# Patient Record
Sex: Male | Born: 1985 | Race: White | Hispanic: No | Marital: Single | Smoking: Current every day smoker
Health system: Southern US, Community
[De-identification: ages and names within clinical notes are randomized; demographics above are authoritative.]

## PROBLEM LIST (undated history)

## (undated) HISTORY — PX: CHOLECYSTECTOMY: SHX55

---
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
## 2008-02-21 ENCOUNTER — Emergency Department: Payer: Self-pay | Admitting: Emergency Medicine

## 2009-07-10 ENCOUNTER — Emergency Department: Payer: Self-pay | Admitting: Emergency Medicine

## 2009-09-19 ENCOUNTER — Emergency Department: Payer: Self-pay | Admitting: Emergency Medicine

## 2009-09-22 ENCOUNTER — Ambulatory Visit: Payer: Self-pay | Admitting: Surgery

## 2010-08-22 IMAGING — CR DG CHEST 2V
1 series · 2 of 2 positions shown · non-contrast
Comparison: none

REASON FOR EXAM: COUGH
COMMENTS:

PROCEDURE:     DXR - DXR CHEST PA (OR AP) AND LATERAL  - September 19, 2009  [DATE]
RESULT:     The lungs are adequately inflated. There is no focal infiltrate.
The heart is normal in size. The pulmonary vascularity is not engorged.
There is no pleural effusion.

[Series 1: view not recorded · 0.17mm/px · 2 of 2 slices shown]
[im 1/2]
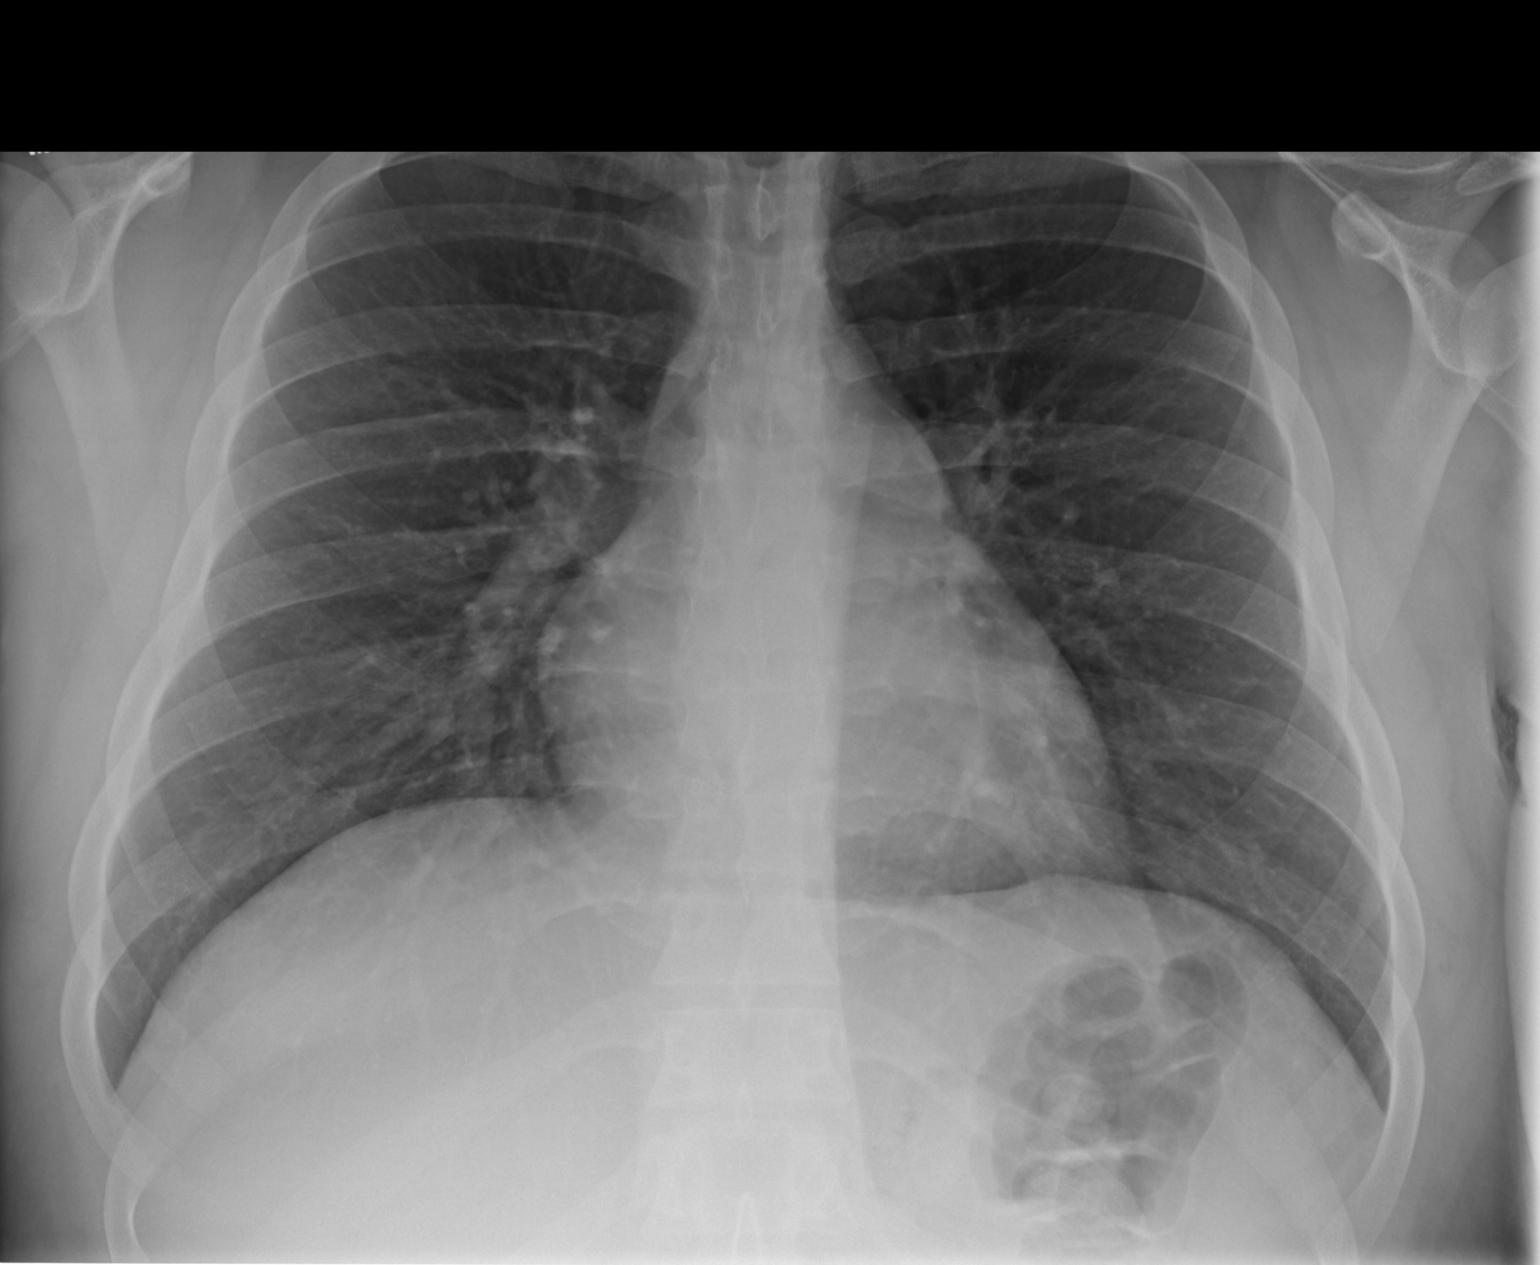
[im 2/2]
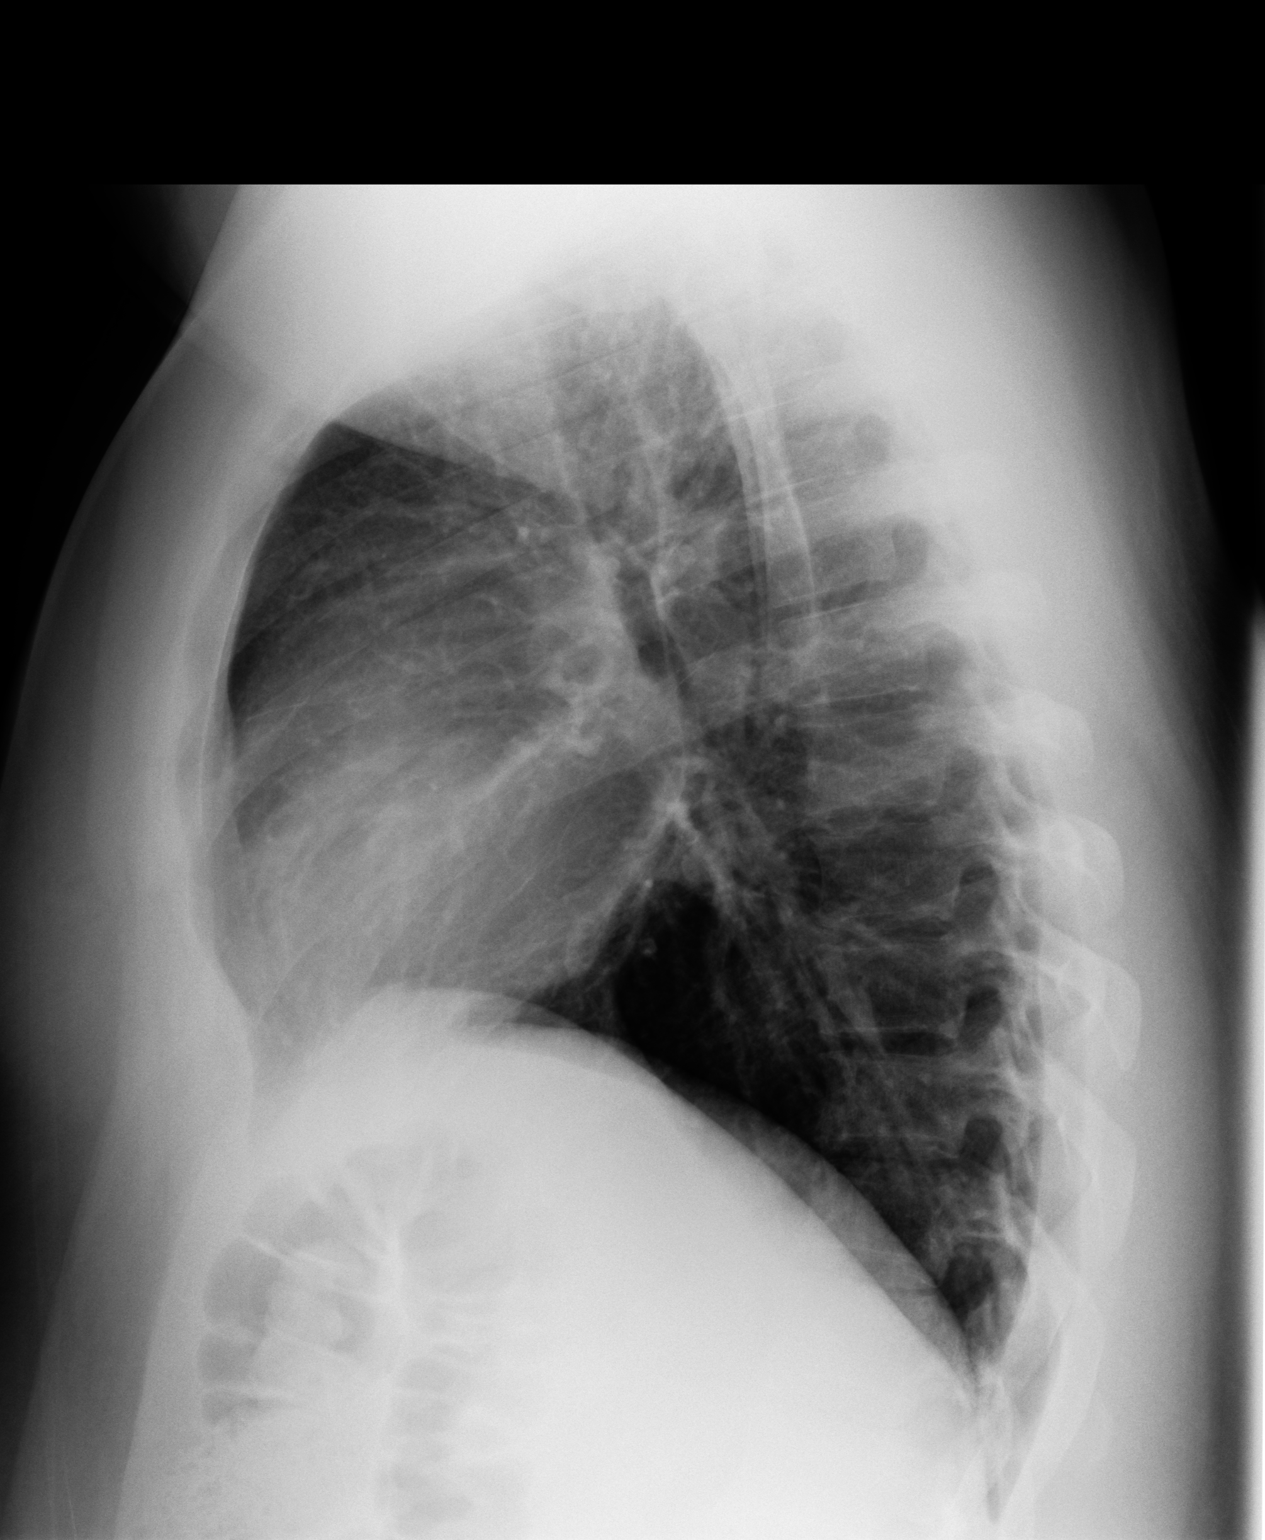

[2 of 2 positions shown; findings below may reference images not displayed]

IMPRESSION: I do not see evidence of pneumonia nor CHF nor other acute
cardiopulmonary abnormality.

## 2010-08-22 IMAGING — US ABDOMEN ULTRASOUND
1 series · 17 of 25 positions shown · non-contrast
Comparison: none

REASON FOR EXAM: RUQ PAIN
COMMENTS:

[Series 1: abdomen ultrasound · 17 of 68 slices shown]
[im 1/68]
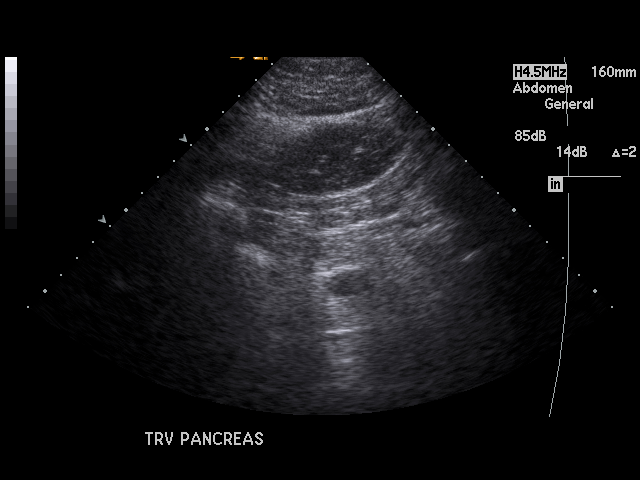
[im 6/68]
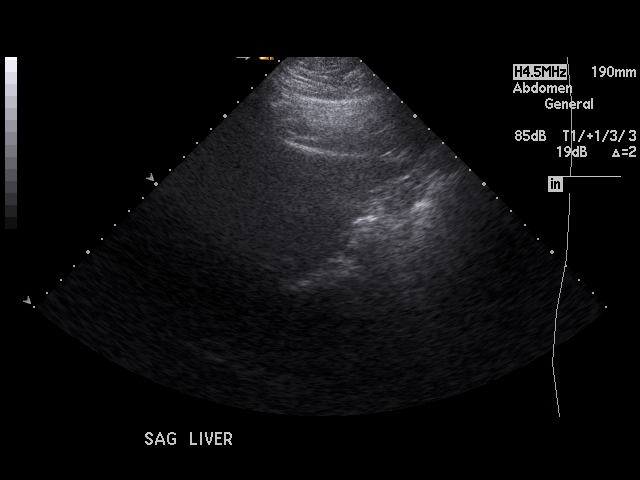
[im 9/68]
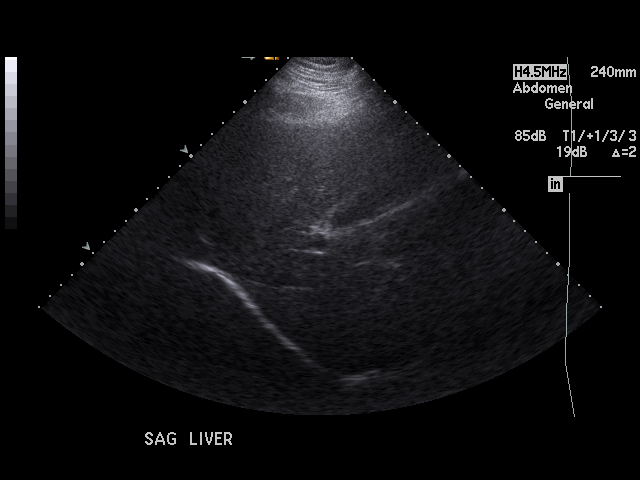
[im 14/68]
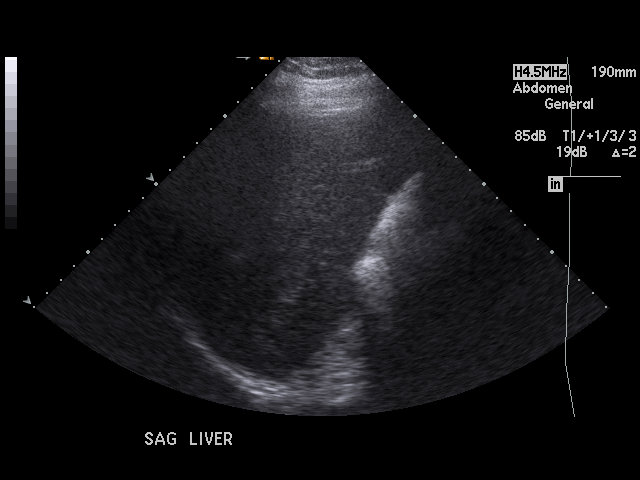
[im 17/68]
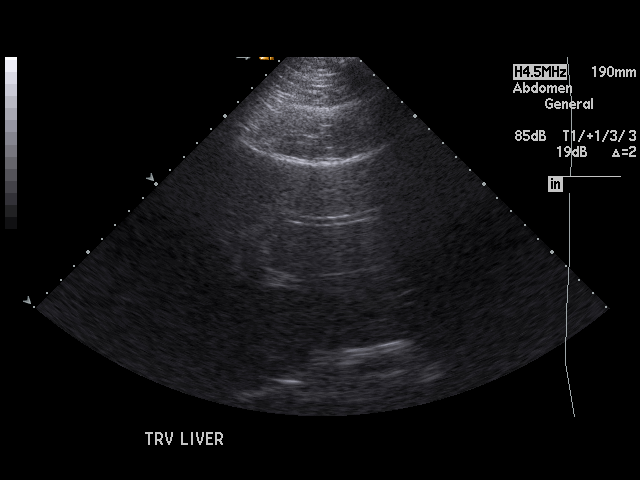
[im 23/68]
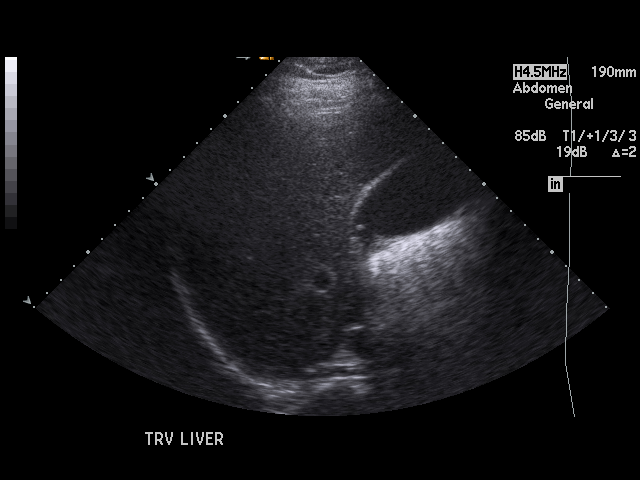
[im 26/68]
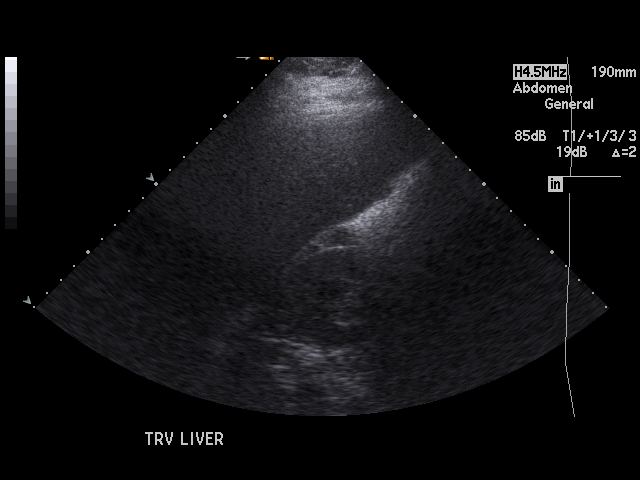
[im 31/68]
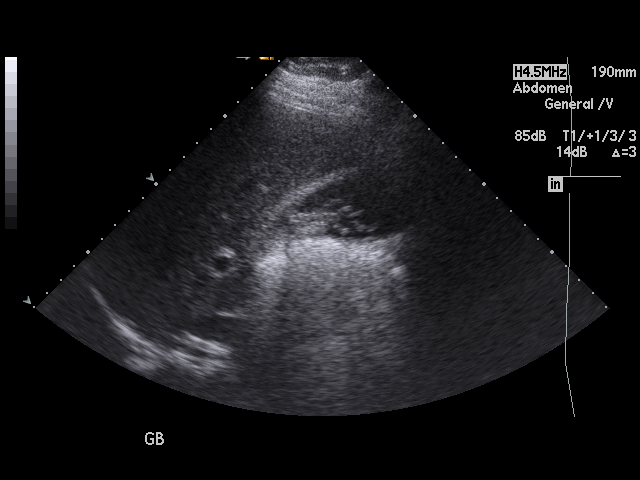
[im 34/68]
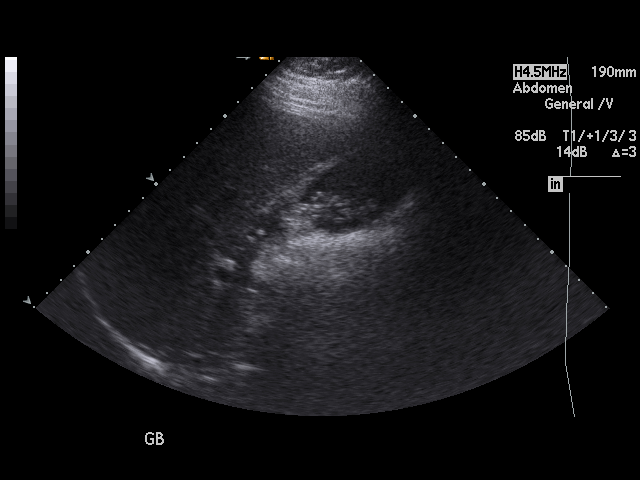
[im 37/68]
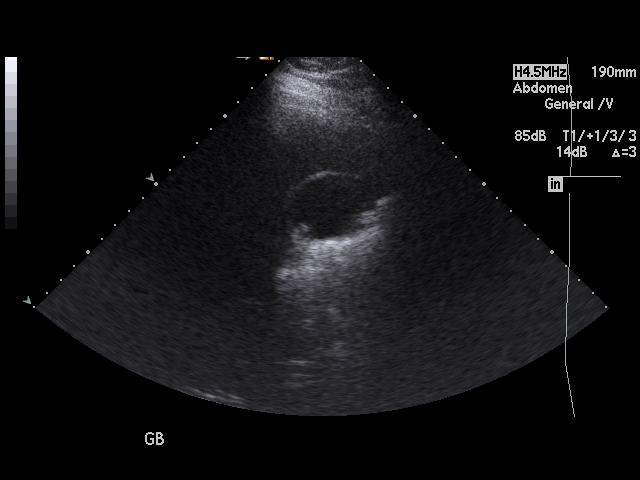
[im 42/68]
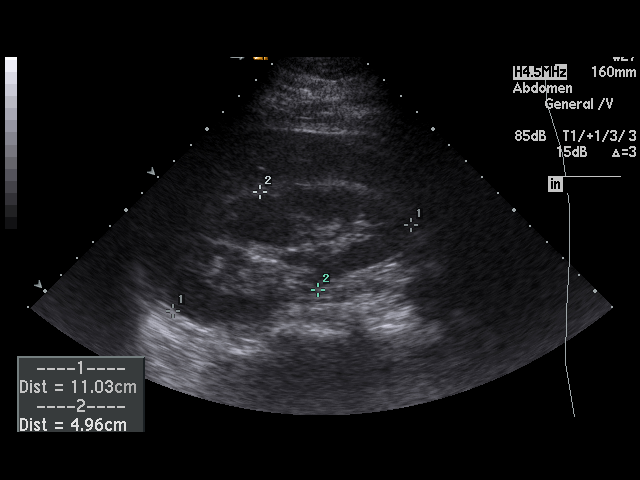
[im 45/68]
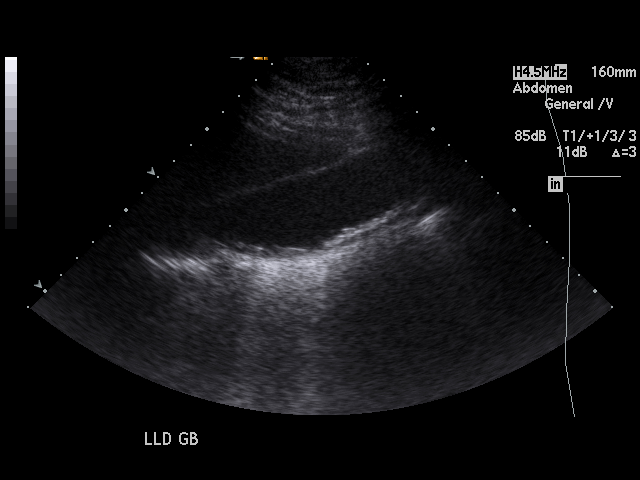
[im 51/68]
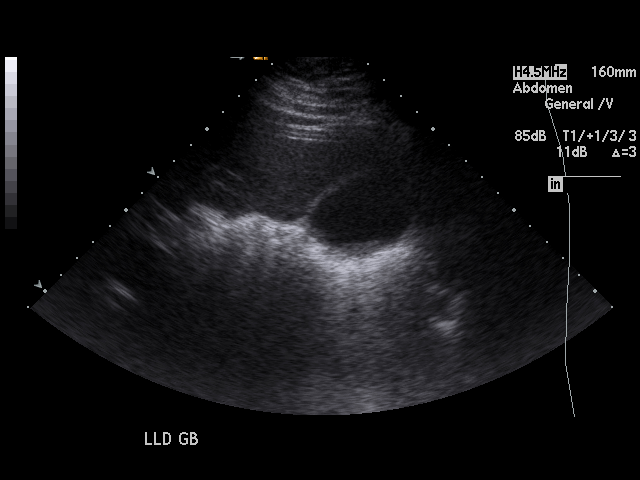
[im 54/68]
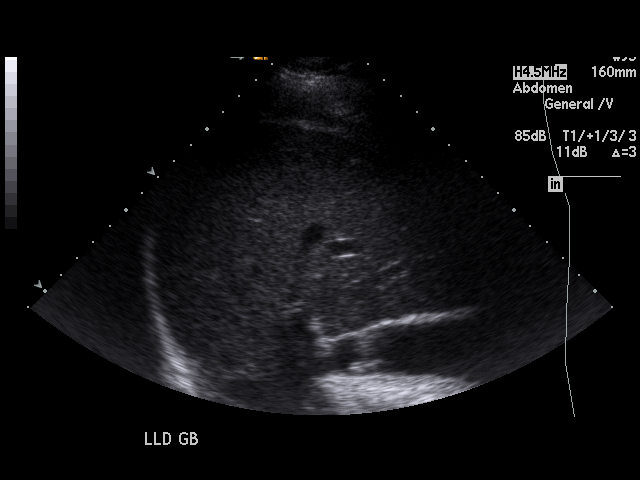
[im 59/68]
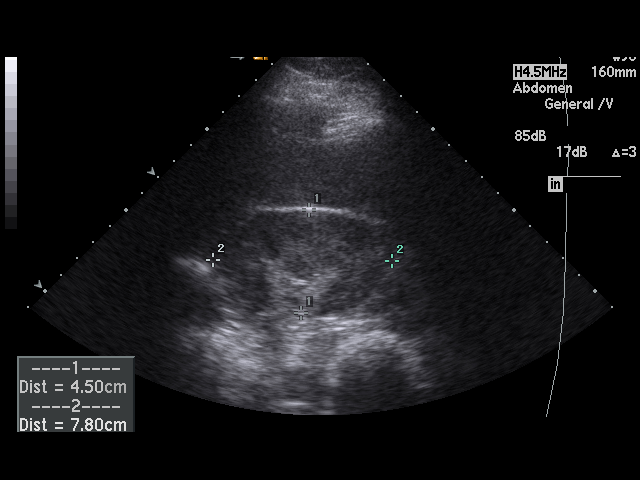
[im 62/68]
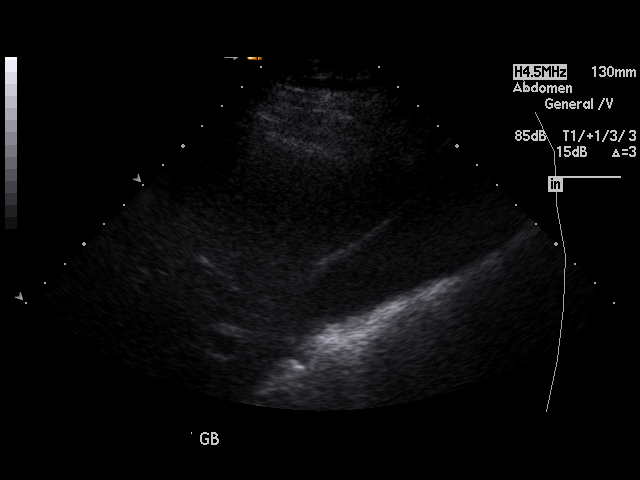
[im 68/68]
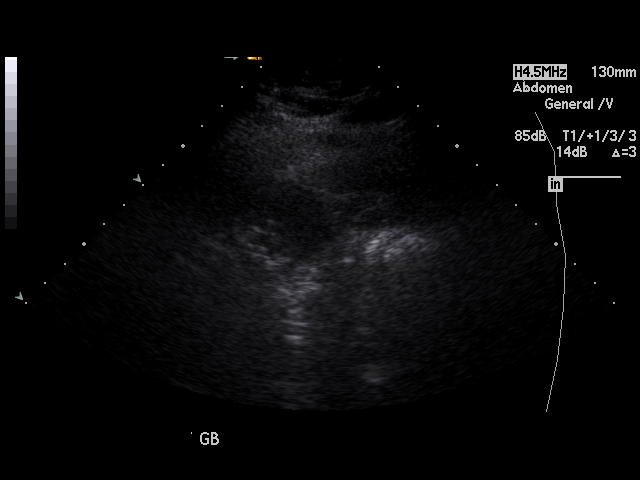

[17 of 25 positions shown; findings below may reference images not displayed]

PROCEDURE:     US  - US ABDOMEN GENERAL SURVEY  - September 19, 2009  [DATE]

RESULT:     The gallbladder is adequately distended and contains multiple
echogenic mobile shadowing stones. There is no sonographic Murphy's sign.
The gallbladder wall remains normal at 2.9 mm. There is no pericholecystic
fluid. The common bile duct is normal at 4.3 mm in diameter. The liver
exhibits no focal mass nor ductal dilation. Portal venous flow is normal in
direction toward the liver. The spleen and kidneys are normal in appearance.
The abdominal aorta and pancreas could not be adequately demonstrated due to
the presence of bowel gas. There is no evidence of ascites.
IMPRESSION: 1. There are multiple mobile gallstones present without evidence of a
sonographic Murphy's sign.
2. Evaluation of the pancreas and abdominal aorta is quite limited due to
bowel gas.

## 2012-06-14 ENCOUNTER — Emergency Department: Payer: Self-pay | Admitting: Emergency Medicine

## 2013-08-22 ENCOUNTER — Emergency Department: Payer: Self-pay | Admitting: Emergency Medicine

## 2013-08-30 ENCOUNTER — Emergency Department: Payer: Self-pay | Admitting: Emergency Medicine

## 2014-06-18 ENCOUNTER — Emergency Department: Payer: Self-pay | Admitting: Emergency Medicine

## 2014-06-18 LAB — CBC WITH DIFFERENTIAL/PLATELET
BASOS ABS: 0 10*3/uL (ref 0.0–0.1)
BASOS PCT: 0.6 %
EOS PCT: 3.2 %
Eosinophil #: 0.2 10*3/uL (ref 0.0–0.7)
HCT: 44.8 % (ref 40.0–52.0)
HGB: 14.9 g/dL (ref 13.0–18.0)
LYMPHS PCT: 33.4 %
Lymphocyte #: 2.4 10*3/uL (ref 1.0–3.6)
MCH: 28.8 pg (ref 26.0–34.0)
MCHC: 33.3 g/dL (ref 32.0–36.0)
MCV: 87 fL (ref 80–100)
MONOS PCT: 5.9 %
Monocyte #: 0.4 x10 3/mm (ref 0.2–1.0)
NEUTROS ABS: 4.2 10*3/uL (ref 1.4–6.5)
Neutrophil %: 56.9 %
Platelet: 220 10*3/uL (ref 150–440)
RBC: 5.17 10*6/uL (ref 4.40–5.90)
RDW: 14 % (ref 11.5–14.5)
WBC: 7.3 10*3/uL (ref 3.8–10.6)

## 2014-06-18 LAB — URINALYSIS, COMPLETE
BLOOD: NEGATIVE
Bacteria: NEGATIVE
Bilirubin,UR: NEGATIVE
GLUCOSE, UR: NEGATIVE mg/dL (ref 0–75)
Ketone: NEGATIVE
LEUKOCYTE ESTERASE: NEGATIVE
Nitrite: NEGATIVE
PROTEIN: NEGATIVE
Ph: 7 (ref 4.5–8.0)
RBC,UR: NONE SEEN /HPF (ref 0–5)
SPECIFIC GRAVITY: 1.023 (ref 1.003–1.030)
WBC UR: NONE SEEN /HPF (ref 0–5)

## 2014-06-18 LAB — COMPREHENSIVE METABOLIC PANEL
ALK PHOS: 94 U/L
Albumin: 3 g/dL — ABNORMAL LOW (ref 3.4–5.0)
Anion Gap: 11 (ref 7–16)
BUN: 15 mg/dL (ref 7–18)
Bilirubin,Total: 0.4 mg/dL (ref 0.2–1.0)
CO2: 17 mmol/L — AB (ref 21–32)
CREATININE: 0.86 mg/dL (ref 0.60–1.30)
Calcium, Total: 8.7 mg/dL (ref 8.5–10.1)
Chloride: 109 mmol/L — ABNORMAL HIGH (ref 98–107)
EGFR (African American): >60
Glucose: 88 mg/dL (ref 65–99)
Osmolality: 274 (ref 275–301)
Potassium: 4 mmol/L (ref 3.5–5.1)
SGOT(AST): 70 U/L — ABNORMAL HIGH (ref 15–37)
SGPT (ALT): 39 U/L (ref 12–78)
Sodium: 137 mmol/L (ref 136–145)
TOTAL PROTEIN: 7.7 g/dL (ref 6.4–8.2)

## 2014-06-18 LAB — TROPONIN I

## 2014-08-03 ENCOUNTER — Emergency Department: Payer: Self-pay | Admitting: Emergency Medicine

## 2014-08-25 ENCOUNTER — Emergency Department: Payer: Self-pay | Admitting: Emergency Medicine

## 2014-12-02 ENCOUNTER — Emergency Department: Payer: Self-pay | Admitting: Student

## 2015-03-24 ENCOUNTER — Emergency Department
Admit: 2015-03-24 | Disposition: A | Discharge status: Home / Self Care | Payer: Self-pay | Admitting: Emergency Medicine

## 2015-06-02 ENCOUNTER — Emergency Department
Admission: EM | Admit: 2015-06-02 | Discharge: 2015-06-02 | Disposition: A | Discharge status: Home / Self Care | Payer: Self-pay | Attending: Emergency Medicine | Admitting: Emergency Medicine

## 2015-06-02 ENCOUNTER — Emergency Department: Payer: Self-pay

## 2015-06-02 ENCOUNTER — Other Ambulatory Visit: Payer: Self-pay

## 2015-06-02 ENCOUNTER — Encounter: Payer: Self-pay | Admitting: Urgent Care

## 2015-06-02 DIAGNOSIS — R079 Chest pain, unspecified: Secondary | ICD-10-CM | POA: Insufficient documentation

## 2015-06-02 DIAGNOSIS — R42 Dizziness and giddiness: Secondary | ICD-10-CM | POA: Insufficient documentation

## 2015-06-02 DIAGNOSIS — Z720 Tobacco use: Secondary | ICD-10-CM | POA: Insufficient documentation

## 2015-06-02 DIAGNOSIS — J209 Acute bronchitis, unspecified: Secondary | ICD-10-CM | POA: Insufficient documentation

## 2015-06-02 MED ORDER — AZITHROMYCIN 250 MG PO TABS
ORAL_TABLET | ORAL | Status: AC
  Administered 2015-06-02: 500 mg via ORAL
  Filled 2015-06-02: qty 2

## 2015-06-02 MED ORDER — AZITHROMYCIN 250 MG PO TABS
ORAL_TABLET | ORAL | Status: AC
  Filled 2015-06-02: qty 2

## 2015-06-02 MED ORDER — AZITHROMYCIN 250 MG PO TABS: 500.0000 mg | ORAL_TABLET | Freq: Every day | ORAL | Status: DC

## 2015-06-02 MED ORDER — BENZONATATE 100 MG PO CAPS
200.0000 mg | ORAL_CAPSULE | Freq: Once | ORAL | Status: AC
  Administered 2015-06-02: 200 mg via ORAL

## 2015-06-02 MED ORDER — BENZONATATE 100 MG PO CAPS
ORAL_CAPSULE | ORAL | Status: AC
  Filled 2015-06-02: qty 1

## 2015-06-02 MED ORDER — BENZONATATE 100 MG PO CAPS
ORAL_CAPSULE | ORAL | Status: AC
  Administered 2015-06-02: 200 mg via ORAL
  Filled 2015-06-02: qty 2

## 2015-06-02 MED ORDER — AZITHROMYCIN 250 MG PO TABS
500.0000 mg | ORAL_TABLET | Freq: Once | ORAL | Status: AC
  Administered 2015-06-02: 500 mg via ORAL

## 2015-06-02 MED ORDER — BENZONATATE 200 MG PO CAPS: 200.0000 mg | ORAL_CAPSULE | Freq: Three times a day (TID) | ORAL | Status: DC | PRN

## 2015-06-02 NOTE — ED Provider Notes (Signed)
[facility] Emergency Department Provider Note  ____________________________________________  Time seen: 2:20 AM  I have reviewed the triage vital signs and the nursing notes.   HISTORY  Chief Complaint Chest Pain; Cough; and Dizziness      HPI Derek Ellis is a 29 y.o. male presents with history of cough this fever 3 days. Patient smokes a pack cigarettes a day.      History reviewed. No pertinent past medical history.  There are no active problems to display for this patient.   Past Surgical History  Procedure Laterality Date  . Cholecystectomy      No current outpatient prescriptions on file.  Allergies Review of patient's allergies indicates no known allergies.  No family history on file.  Social History History  Substance Use Topics  . Smoking status: Current Every Day Smoker  . Smokeless tobacco: Not on file  . Alcohol Use: Yes    Review of Systems  Constitutional: Negative for fever. Eyes: Negative for visual changes. ENT: Negative for sore throat. Cardiovascular: Negative for chest pain. Respiratory: Negative for shortness of breath. Gastrointestinal: Negative for abdominal pain, vomiting and diarrhea. Genitourinary: Negative for dysuria. Musculoskeletal: Negative for back pain. Skin: Negative for rash. Neurological: Negative for headaches, focal weakness or numbness.   10-point ROS otherwise negative.  ____________________________________________   PHYSICAL EXAM:  VITAL SIGNS: ED Triage Vitals  Enc Vitals Group     BP 06/02/15 0101 150/87 mmHg     Pulse Rate 06/02/15 0101 101     Resp 06/02/15 0101 18     Temp 06/02/15 0101 99.1 F (37.3 C)     Temp Source 06/02/15 0101 Oral     SpO2 06/02/15 0101 93 %     Weight 06/02/15 0101 350 lb (158.759 kg)     Height 06/02/15 0101 6' (1.829 m)     Head Cir --      Peak Flow --      Pain Score 06/02/15 0104 7     Pain Loc --      Pain Edu? --      Excl. in GC?  --      Constitutional: Alert and oriented. Well appearing and in no distress. Eyes: Conjunctivae are normal. PERRL. Normal extraocular movements. ENT   Head: Normocephalic and atraumatic.   Nose: No congestion/rhinnorhea.   Mouth/Throat: Mucous membranes are moist.   Neck: No stridor. Hematological/Lymphatic/Immunilogical: No cervical lymphadenopathy. Cardiovascular: Normal rate, regular rhythm. Normal and symmetric distal pulses are present in all extremities. No murmurs, rubs, or gallops. Respiratory: Normal respiratory effort without tachypnea nor retractions. Breath sounds are clear and equal bilaterally. No wheezes/rales/rhonchi. Gastrointestinal: Soft and nontender. No distention. There is no CVA tenderness. Genitourinary: deferred Musculoskeletal: Nontender with normal range of motion in all extremities. No joint effusions.  No lower extremity tenderness nor edema. Neurologic:  Normal speech and language. No gross focal neurologic deficits are appreciated. Speech is normal.  Skin:  Skin is warm, dry and intact. No rash noted. Psychiatric: Mood and affect are normal. Speech and behavior are normal. Patient exhibits appropriate insight and judgment.  ____________________________________________   Radiology: Chest x-ray revealed no acute cardiopulmonary process    INITIAL IMPRESSION / ASSESSMENT AND PLAN / ED COURSE  Pertinent labs & imaging results that were available during my care of the patient were reviewed by me and considered in my medical decision making (see chart for details).  History physical exam, chest x-ray consistent with acute bronchitis as such patient  received azithromycin 500 mg tablet 1 and Tessalon Perles for cough. We prescribed the same at home  ____________________________________________   FINAL CLINICAL IMPRESSION(S) / ED DIAGNOSES  Final diagnoses:  Acute bronchitis, unspecified organism      Darci Current, MD 06/02/15  0300

## 2015-06-02 NOTE — ED Notes (Signed)
Spoke with Dr. Manson Passey regarding presenting c/o and triage assessment. MD only wanting the previously ordered EKG and 2 view CXR; no labs at this time. Orders to be entered by this RN.

## 2015-06-02 NOTE — Discharge Instructions (Signed)
Acute Bronchitis °Bronchitis is inflammation of the airways that extend from the windpipe into the lungs (bronchi). The inflammation often causes mucus to develop. This leads to a cough, which is the most common symptom of bronchitis.  °In acute bronchitis, the condition usually develops suddenly and goes away over time, usually in a couple weeks. Smoking, allergies, and asthma can make bronchitis worse. Repeated episodes of bronchitis Jacobowitz cause further lung problems.  °CAUSES °Acute bronchitis is most often caused by the same virus that causes a cold. The virus can spread from person to person (contagious) through coughing, sneezing, and touching contaminated objects. °SIGNS AND SYMPTOMS  °· Cough.   °· Fever.   °· Coughing up mucus.   °· Body aches.   °· Chest congestion.   °· Chills.   °· Shortness of breath.   °· Sore throat.   °DIAGNOSIS  °Acute bronchitis is usually diagnosed through a physical exam. Your health care provider will also ask you questions about your medical history. Tests, such as chest X-rays, are sometimes done to rule out other conditions.  °TREATMENT  °Acute bronchitis usually goes away in a couple weeks. Oftentimes, no medical treatment is necessary. Medicines are sometimes given for relief of fever or cough. Antibiotic medicines are usually not needed but Labreck be prescribed in certain situations. In some cases, an inhaler Hardiman be recommended to help reduce shortness of breath and control the cough. A cool mist vaporizer Schader also be used to help thin bronchial secretions and make it easier to clear the chest.  °HOME CARE INSTRUCTIONS °· Get plenty of rest.   °· Drink enough fluids to keep your urine clear or pale yellow (unless you have a medical condition that requires fluid restriction). Increasing fluids Doane help thin your respiratory secretions (sputum) and reduce chest congestion, and it will prevent dehydration.   °· Take medicines only as directed by your health care provider. °· If  you were prescribed an antibiotic medicine, finish it all even if you start to feel better. °· Avoid smoking and secondhand smoke. Exposure to cigarette smoke or irritating chemicals will make bronchitis worse. If you are a smoker, consider using nicotine gum or skin patches to help control withdrawal symptoms. Quitting smoking will help your lungs heal faster.   °· Reduce the chances of another bout of acute bronchitis by washing your hands frequently, avoiding people with cold symptoms, and trying not to touch your hands to your mouth, nose, or eyes.   °· Keep all follow-up visits as directed by your health care provider.   °SEEK MEDICAL CARE IF: °Your symptoms do not improve after 1 week of treatment.  °SEEK IMMEDIATE MEDICAL CARE IF: °· You develop an increased fever or chills.   °· You have chest pain.   °· You have severe shortness of breath. °· You have bloody sputum.   °· You develop dehydration. °· You faint or repeatedly feel like you are going to pass out. °· You develop repeated vomiting. °· You develop a severe headache. °MAKE SURE YOU:  °· Understand these instructions. °· Will watch your condition. °· Will get help right away if you are not doing well or get worse. °Document Released: 01/10/2005 Document Revised: 04/19/2014 Document Reviewed: 05/26/2013 °Exit[employer]® Patient Information ©2015 [employer]. This information is not intended to replace advice given to you by your health care provider. Make sure you discuss any questions you have with your health care provider. ° °

## 2015-06-02 NOTE — ED Notes (Signed)
Patient presents with c/o fever, cough, SOB and a non -specific CP x the last 2-3 days. Fevers have reached in excess of 100.5 per patient report.

## 2016-05-04 IMAGING — CR DG CHEST 2V
1 series · 2 of 2 positions shown · non-contrast
Comparison: 09/19/2009

CLINICAL DATA: Fever, cough, shortness of breath, and nonspecific
chest pain over the last 3 days. History of asthma and smoking.

EXAM:
CHEST  2 VIEW

[Series 1: dg chest 2 view · 0.14mm/px · 2 of 2 slices shown]
[im 1/2]
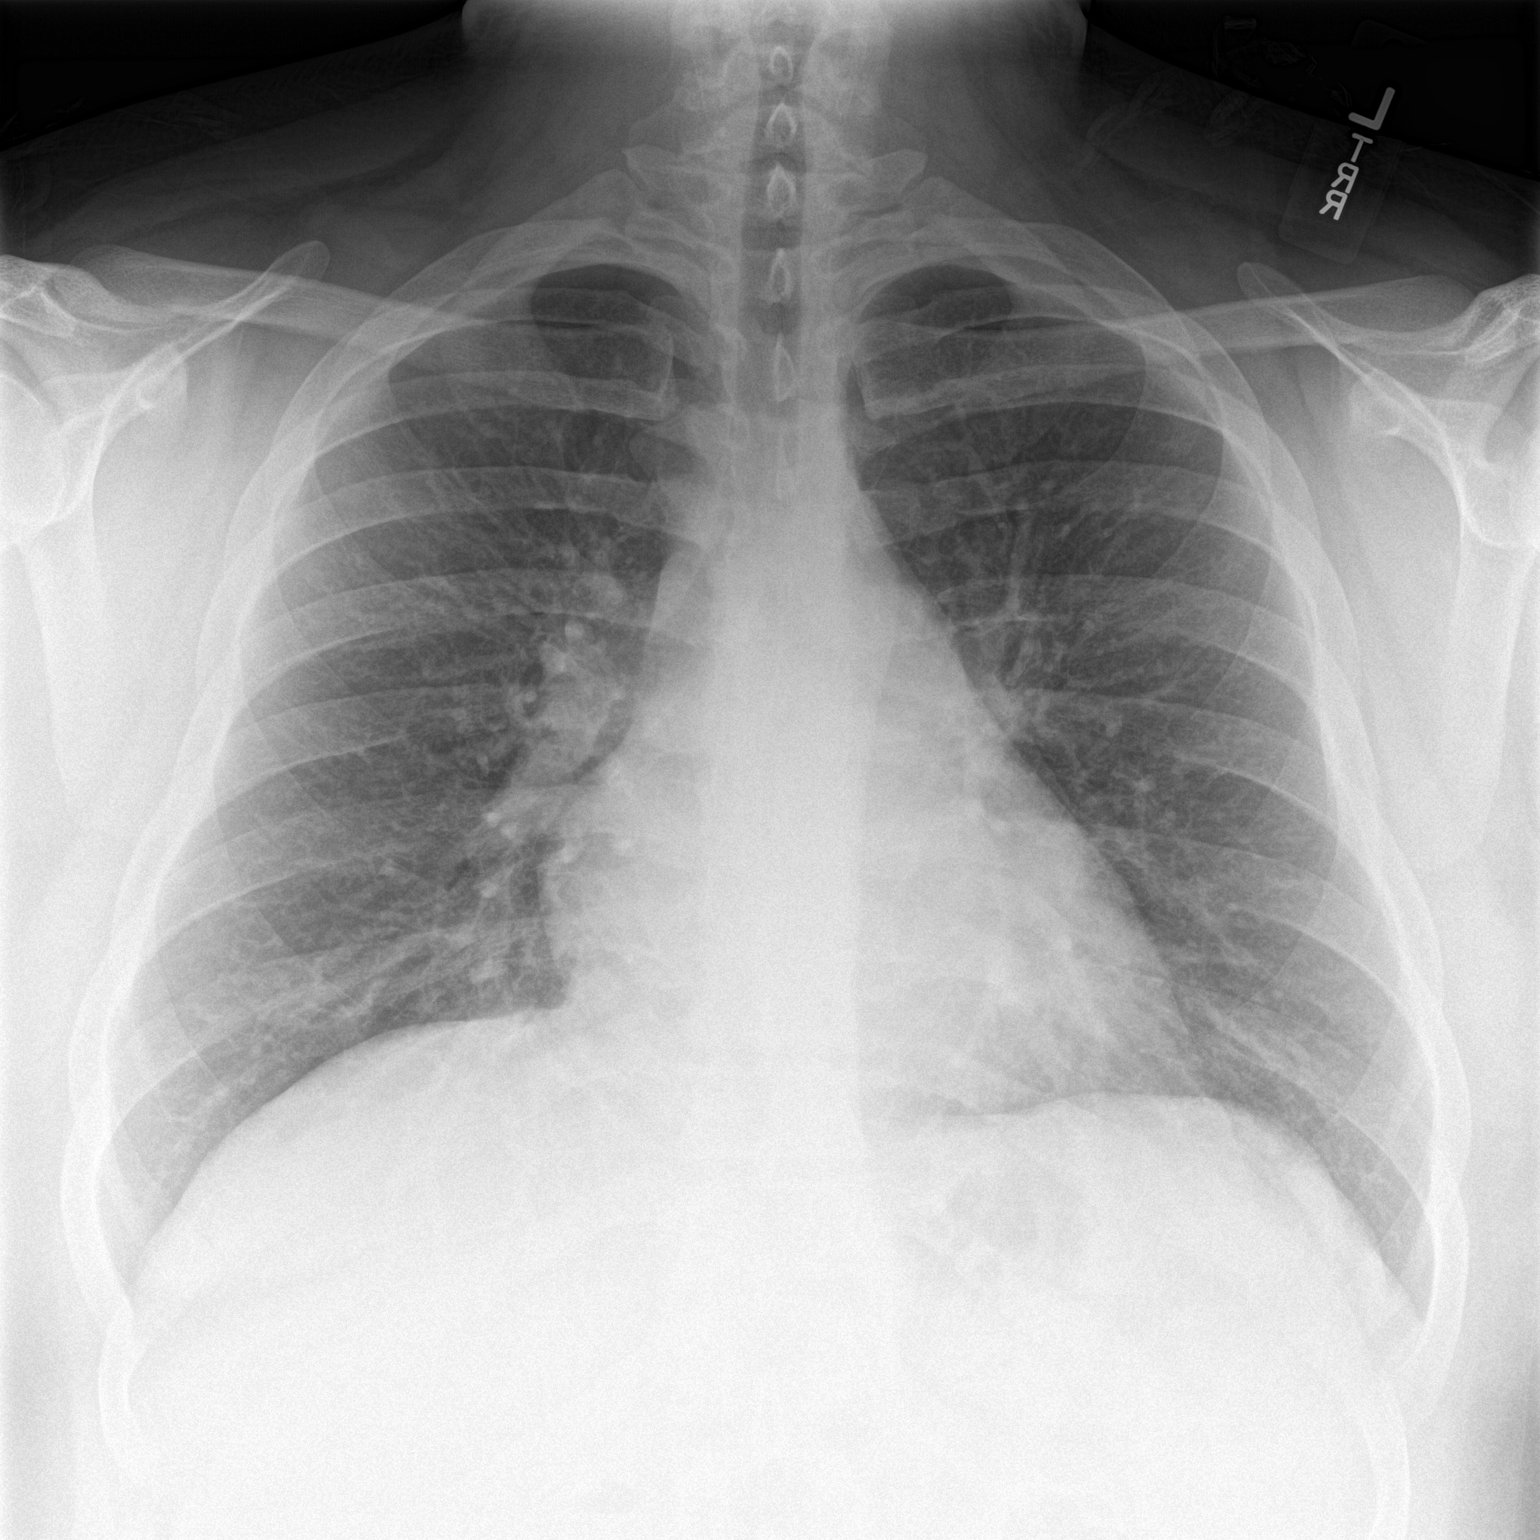
[im 2/2]
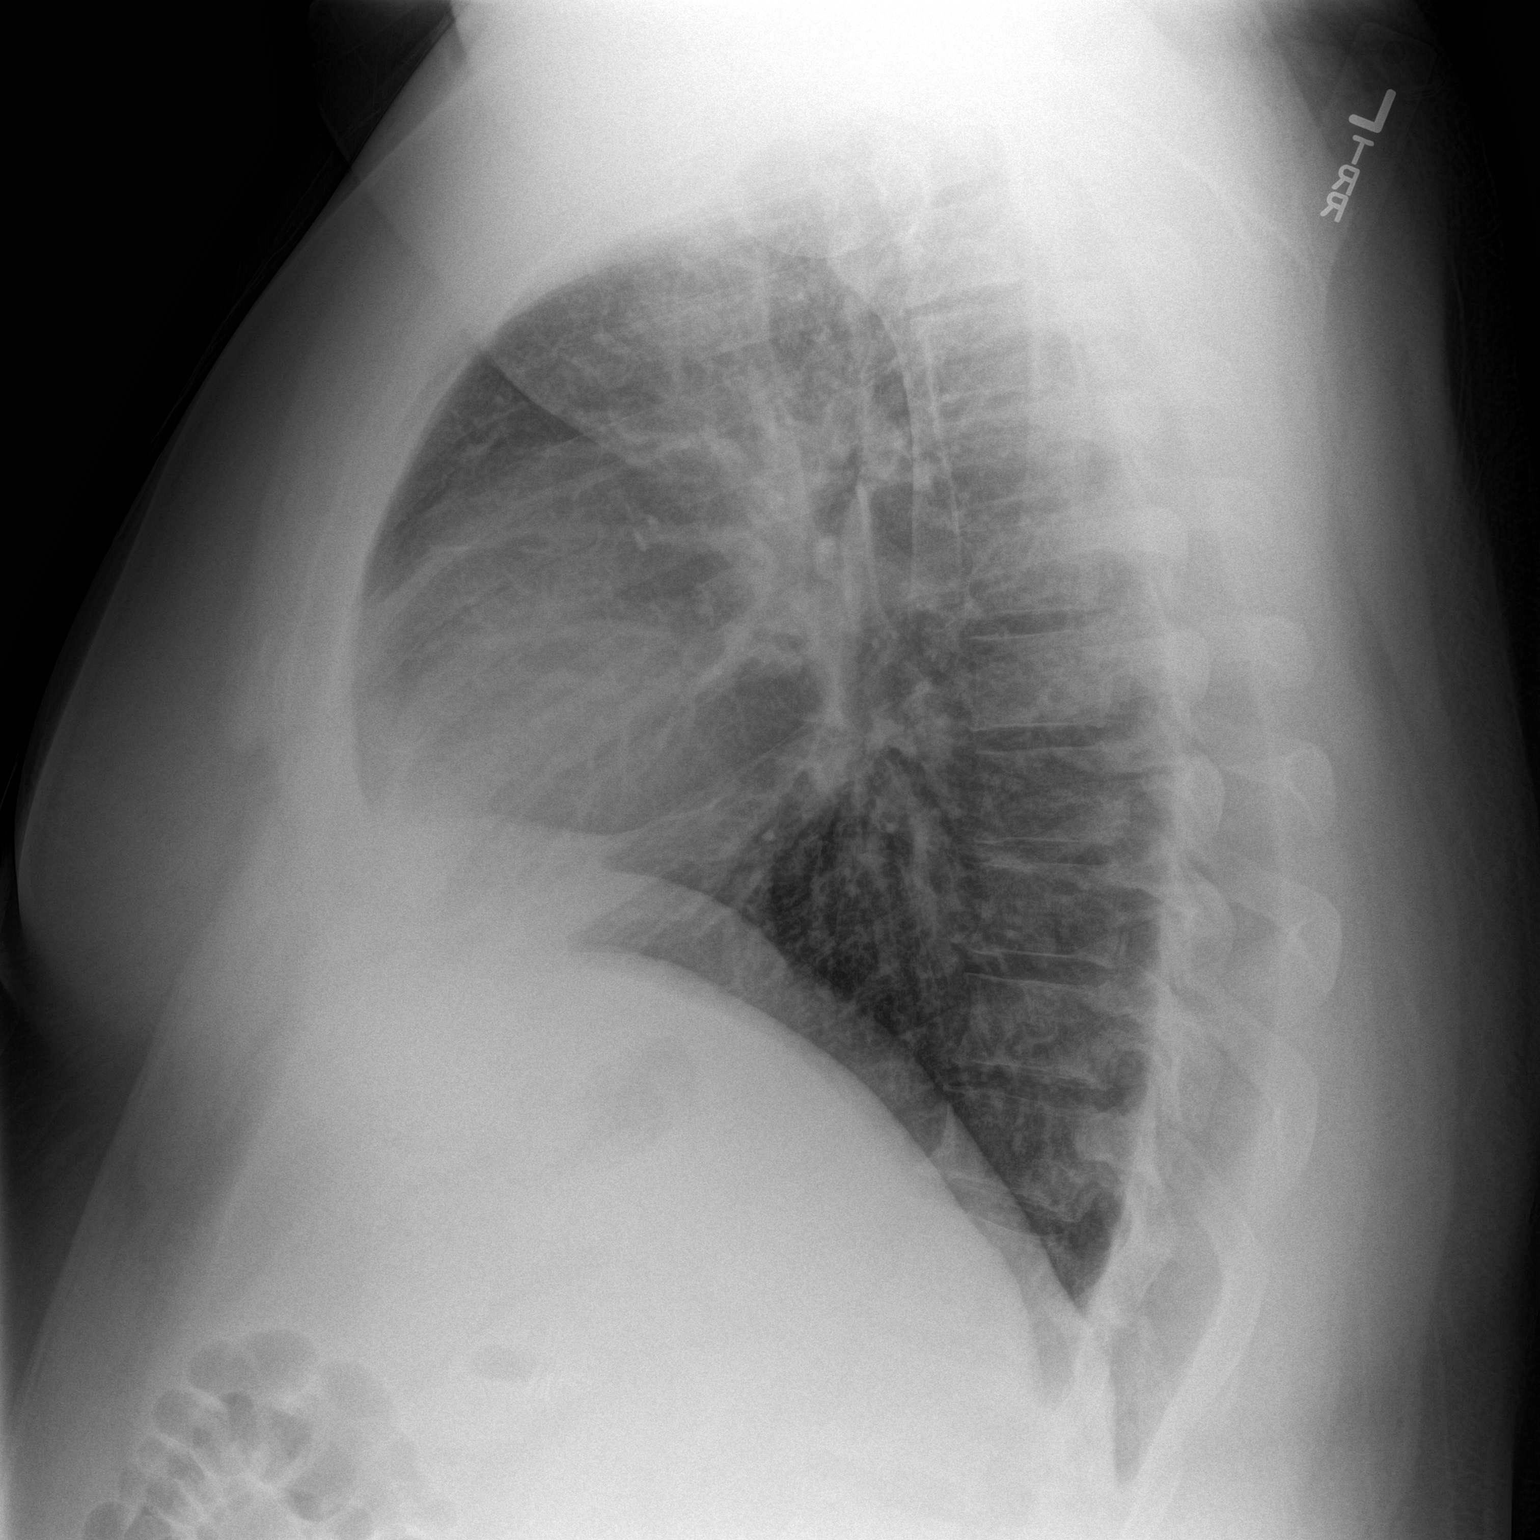

[2 of 2 positions shown; findings below may reference images not displayed]

FINDINGS: The heart size and mediastinal contours are within normal limits.
Both lungs are clear. The visualized skeletal structures are
unremarkable.
IMPRESSION: No active cardiopulmonary disease.

## 2017-05-11 ENCOUNTER — Emergency Department: Payer: Self-pay

## 2017-05-11 ENCOUNTER — Encounter: Payer: Self-pay | Admitting: Emergency Medicine

## 2017-05-11 ENCOUNTER — Emergency Department
Admission: EM | Admit: 2017-05-11 | Discharge: 2017-05-11 | Disposition: A | Discharge status: Home / Self Care | Payer: Self-pay | Attending: Emergency Medicine | Admitting: Emergency Medicine

## 2017-05-11 DIAGNOSIS — S93401A Sprain of unspecified ligament of right ankle, initial encounter: Secondary | ICD-10-CM | POA: Insufficient documentation

## 2017-05-11 DIAGNOSIS — Y939 Activity, unspecified: Secondary | ICD-10-CM | POA: Insufficient documentation

## 2017-05-11 DIAGNOSIS — Z9049 Acquired absence of other specified parts of digestive tract: Secondary | ICD-10-CM | POA: Insufficient documentation

## 2017-05-11 DIAGNOSIS — Z79899 Other long term (current) drug therapy: Secondary | ICD-10-CM | POA: Insufficient documentation

## 2017-05-11 DIAGNOSIS — Y999 Unspecified external cause status: Secondary | ICD-10-CM | POA: Insufficient documentation

## 2017-05-11 DIAGNOSIS — Y929 Unspecified place or not applicable: Secondary | ICD-10-CM | POA: Insufficient documentation

## 2017-05-11 DIAGNOSIS — W010XXA Fall on same level from slipping, tripping and stumbling without subsequent striking against object, initial encounter: Secondary | ICD-10-CM | POA: Insufficient documentation

## 2017-05-11 DIAGNOSIS — F17200 Nicotine dependence, unspecified, uncomplicated: Secondary | ICD-10-CM | POA: Insufficient documentation

## 2017-05-11 MED ORDER — IBUPROFEN 600 MG PO TABS: 600.0000 mg | ORAL_TABLET | Freq: Four times a day (QID) | ORAL | 0 refills | Status: DC | PRN

## 2017-05-11 MED ORDER — KETOROLAC TROMETHAMINE 30 MG/ML IJ SOLN
30.0000 mg | Freq: Once | INTRAMUSCULAR | Status: AC
Start: 2017-05-11 — End: 2017-05-11
  Administered 2017-05-11: 30 mg via INTRAMUSCULAR
  Filled 2017-05-11: qty 1

## 2017-05-11 NOTE — ED Notes (Addendum)
See triage note  states he slipped and fell 2 weeks ago  But then today he tripped over dog   And twisted ankle again  States ankle gave out  Positive pulses  No swelling noted

## 2017-05-11 NOTE — ED Provider Notes (Signed)
[facility] Emergency Department Provider Note   ____________________________________________   I have reviewed the triage vital signs and the nursing notes.   HISTORY  Chief Complaint Ankle Pain    HPI Derek Ellis is a 31 y.o. male presents with right ankle pain after tripping over the dog, his ankle giving way then falling. Patient was holding her infant son attempting to protect him from the fall. Patient notes rolling his ankle with immediate onset of severe pain in the right ankle. Patient described an inversion ankle injury. Patient reports right ankle pain approximate 2 weeks ago after a slip and fall inversion ankle injury. Patient's description of the injury is consistent with the possible ankle sprain prior to today. Prior to today, patient denies any other right lower extremity. Patient was unable unable to fully bear weight through the right ankle following the injury today. Patient denies head, neck or back pain.   History reviewed. No pertinent past medical history.  There are no active problems to display for this patient.   Past Surgical History:  Procedure Laterality Date  . CHOLECYSTECTOMY      Prior to Admission medications   Medication Sig Start Date End Date Taking? Authorizing Provider  azithromycin (ZITHROMAX) 250 MG tablet Take 2 tablets (500 mg total) by mouth daily. 06/02/15   Darci Current, MD  benzonatate (TESSALON) 200 MG capsule Take 1 capsule (200 mg total) by mouth 3 (three) times daily as needed for cough. 06/02/15   Darci Current, MD  ibuprofen (ADVIL,MOTRIN) 600 MG tablet Take 1 tablet (600 mg total) by mouth every 6 (six) hours as needed. 05/11/17   Little, Traci M, PA-C    Allergies Patient has no known allergies.  No family history on file.  Social History Social History  Substance Use Topics  . Smoking status: Current Every Day Smoker  . Smokeless tobacco: Not on file  . Alcohol use Yes    Review of  Systems Constitutional: Negative for fever/chills Eyes: No visual changes. Cardiovascular: Denies chest pain. Respiratory: Denies cough Denies shortness of breath. Musculoskeletal: Negative for back pain.  Right ankle pain and swelling. Difficulty bearing weight and ambulating secondary to right ankle pain and swelling. Skin:  for rash. Neurological: Negative for headaches.  Negative focal weakness or numbness. Negative for loss of consciousness.  ____________________________________________   PHYSICAL EXAM:  VITAL SIGNS: ED Triage Vitals  Enc Vitals Group     BP 05/11/17 1415 (!) 142/79     Pulse Rate 05/11/17 1415 100     Resp 05/11/17 1415 18     Temp 05/11/17 1415 98.4 F (36.9 C)     Temp Source 05/11/17 1415 Oral     SpO2 05/11/17 1415 97 %     Weight 05/11/17 1420 (!) 350 lb (158.8 kg)     Height 05/11/17 1420 6' (1.829 m)     Head Circumference --      Peak Flow --      Pain Score 05/11/17 1415 10     Pain Loc --      Pain Edu? --      Excl. in GC? --     Constitutional: Alert and oriented. Well appearing and in no acute distress.  Head: Normocephalic and atraumatic. Eyes: Conjunctivae are normal. Cardiovascular: Normal rate, regular rhythm. Normal distal pulses. Respiratory: Normal respiratory effort.  Gastrointestinal: Soft and nontender. Musculoskeletal: Nontender with normal range of motion in all extremities except right ankle secondary to  lateral joint effusion and pain since remote and current injury. Right ankle sensation and pulses intact, range of motion limited by pain and ecchymosis present. Neurologic: Normal speech and language. No gross focal neurologic deficits are appreciated. Gait instability secondary to pain.  Skin:  Skin is warm, dry and intact. No rash noted. Psychiatric: Mood and affect are normal.  ____________________________________________   LABS (all labs ordered are listed, but only abnormal results are displayed)  Labs Reviewed -  No data to display ____________________________________________  EKG None ____________________________________________  RADIOLOGY DG ankle complete FINDINGS: Mild diffuse soft tissue swelling. No underlying fracture or subluxation identified. No radio-opaque foreign body or soft tissue calcification.  IMPRESSION: 1. Soft tissue swelling. 2. No acute bone abnormality. ____________________________________________   PROCEDURES  Procedure(s) performed:  SPLINT APPLICATION Date/Time: 3:06 PM Authorized by: Clois Comberraci M Little Consent: Verbal consent obtained. Risks and benefits: risks, benefits and alternatives were discussed Consent given by: patient Splint applied by: EMT technician Location details: Right ankle Splint type: OCL stir-up ankle splint Supplies used: OCL stirp splint Post-procedure: The splinted body part was neurovascularly unchanged following the procedure. Patient tolerance: Patient tolerated the procedure well with no immediate complications.    Critical Care performed: no ____________________________________________   INITIAL IMPRESSION / ASSESSMENT AND PLAN / ED COURSE  Pertinent labs & imaging results that were available during my care of the patient were reviewed by me and considered in my medical decision making (see chart for details).   Patient presents with right ankle injury consistent with right lateral ankle sprain secondary to fall earlier today. Physical exam and imaging are reassuring for no acute fractures. Right ankle neurovascular intact before and after stirrup splint applied. Patient will remain in the stirrup splint and utilize crutches for mobility until he follows up with orthopedics. Patient will be provided a referral for outpatient for orthopedics. Patient / Family informed of clinical course, understand medical decision-making process, and agree with plan.  Patient was advised to follow up with Orthopedics and was also advised to  return to the emergency department for symptoms that change or worsen.      ____________________________________________   FINAL CLINICAL IMPRESSION(S) / ED DIAGNOSES  Final diagnoses:  Sprain of right ankle, unspecified ligament, initial encounter       NEW MEDICATIONS STARTED DURING THIS VISIT:  Discharge Medication List as of 05/11/2017  3:36 PM    START taking these medications   Details  ibuprofen (ADVIL,MOTRIN) 600 MG tablet Take 1 tablet (600 mg total) by mouth every 6 (six) hours as needed., Starting Sat 05/11/2017, Print         Note:  This document was prepared using Dragon voice recognition software and Belle include unintentional dictation errors.   Clois ComberLittle, Traci M, PA-C 05/11/17 1625    Jeanmarie PlantMcShane, James A, MD 05/12/17 [phone]

## 2017-05-11 NOTE — ED Triage Notes (Signed)
Tripped over dog 30 min ago, pain R ankle. States ankle had been hurting him for 2 days before.

## 2019-02-16 ENCOUNTER — Emergency Department
Admission: EM | Admit: 2019-02-16 | Discharge: 2019-02-16 | Disposition: A | Discharge status: Home / Self Care | Payer: Self-pay | Attending: Emergency Medicine | Admitting: Emergency Medicine

## 2019-02-16 ENCOUNTER — Encounter: Payer: Self-pay | Admitting: Emergency Medicine

## 2019-02-16 DIAGNOSIS — Z79899 Other long term (current) drug therapy: Secondary | ICD-10-CM | POA: Insufficient documentation

## 2019-02-16 DIAGNOSIS — J020 Streptococcal pharyngitis: Secondary | ICD-10-CM | POA: Insufficient documentation

## 2019-02-16 MED ORDER — AMOXICILLIN 875 MG PO TABS: 875.0000 mg | ORAL_TABLET | Freq: Two times a day (BID) | ORAL | 0 refills | Status: AC

## 2019-02-16 MED ORDER — AMOXICILLIN 500 MG PO CAPS
1000.0000 mg | ORAL_CAPSULE | Freq: Once | ORAL | Status: AC
  Administered 2019-02-16: 1000 mg via ORAL
  Filled 2019-02-16: qty 2

## 2019-02-16 MED ORDER — MAGIC MOUTHWASH W/LIDOCAINE: 5.0000 mL | Freq: Four times a day (QID) | ORAL | 0 refills | Status: AC

## 2019-02-16 NOTE — ED Provider Notes (Signed)
[facility] Emergency Department Provider Note  ____________________________________________  Time seen: Approximately 3:46 PM  I have reviewed the triage vital signs and the nursing notes.   HISTORY  Chief Complaint Sore Throat    HPI Derek Ellis is a 33 y.o. male who presents the emergency department complaining of subjective fever, sore throat starting today.  Patient reports that he woke up with a "scratchy throat but this quickly worsened.  Patient feels like he is "swallowing razor blades."  Patient is able to breathe and swallow appropriately however.  Patient states that he also had a headache earlier but treated this with a BC powder and currently denies any headache.  Patient denies any nasal congestion, coughing, abdominal pain, nausea vomiting, diarrhea or constipation.  Patient reports that he did take his son to the pediatrician's office for unrelated complaint several days prior to onset of symptoms and states "I was afraid I might get something from all the sick kids."  Patient has taken [facility] powder but no other medications prior to arrival.    History reviewed. No pertinent past medical history.  There are no active problems to display for this patient.   Past Surgical History:  Procedure Laterality Date  . CHOLECYSTECTOMY      Prior to Admission medications   Medication Sig Start Date End Date Taking? Authorizing Provider  amoxicillin (AMOXIL) 875 MG tablet Take 1 tablet (875 mg total) by mouth 2 (two) times daily. 02/16/19   Dayveon Halley, Delorise Royals, PA-C  magic mouthwash w/lidocaine SOLN Take 5 mLs by mouth 4 (four) times daily. 02/16/19   Christopher Glasscock, Delorise Royals, PA-C    Allergies Patient has no known allergies.  No family history on file.  Social History Social History   Tobacco Use  . Smoking status: Current Every Day Smoker  Substance Use Topics  . Alcohol use: Yes  . Drug use: No     Review of Systems  Constitutional: Subjective  fever/chills Eyes: No visual changes. No discharge ENT: Positive for sore throat Cardiovascular: no chest pain. Respiratory: no cough. No SOB. Gastrointestinal: No abdominal pain.  No nausea, no vomiting.  No diarrhea.  No constipation. Musculoskeletal: Negative for musculoskeletal pain. Skin: Negative for rash, abrasions, lacerations, ecchymosis. Neurological: Headache earlier today, none currently.  Denies focal weakness or numbness. 10-point ROS otherwise negative.  ____________________________________________   PHYSICAL EXAM:  VITAL SIGNS: ED Triage Vitals  Enc Vitals Group     BP 02/16/19 1453 (!) 135/91     Pulse Rate 02/16/19 1453 (!) 111     Resp 02/16/19 1453 16     Temp 02/16/19 1453 99.1 F (37.3 C)     Temp Source 02/16/19 1453 Oral     SpO2 02/16/19 1453 99 %     Weight 02/16/19 1448 267 lb (121.1 kg)     Height 02/16/19 1448 6' (1.829 m)     Head Circumference --      Peak Flow --      Pain Score 02/16/19 1447 8     Pain Loc --      Pain Edu? --      Excl. in GC? --      Constitutional: Alert and oriented. Well appearing and in no acute distress. Eyes: Conjunctivae are normal. PERRL. EOMI. Head: Atraumatic. ENT:      Ears: EACs and TMs unremarkable bilaterally      Nose: No congestion/rhinnorhea.      Mouth/Throat: Mucous membranes are moist.  Tonsils are  grossly erythematous, edematous bilaterally.  Exudates bilaterally.  Tonsils are equally edematous and uvula is midline.  No indication of peritonsillar abscess or retropharyngeal abscess.  No indication of Ludwick's angina Neck: No stridor.  Neck is supple full range of motion Hematological/Lymphatic/Immunilogical: Scattered, mobile, tender anterior cervical lymphadenopathy. Cardiovascular: Normal rate, regular rhythm. Normal S1 and S2.  Good peripheral circulation. Respiratory: Normal respiratory effort without tachypnea or retractions. Lungs CTAB. Good air entry to the bases with no decreased or absent  breath sounds. Musculoskeletal: Full range of motion to all extremities. No gross deformities appreciated. Neurologic:  Normal speech and language. No gross focal neurologic deficits are appreciated.  Skin:  Skin is warm, dry and intact. No rash noted. Psychiatric: Mood and affect are normal. Speech and behavior are normal. Patient exhibits appropriate insight and judgement.   ____________________________________________   LABS (all labs ordered are listed, but only abnormal results are displayed)  Labs Reviewed - No data to display ____________________________________________  EKG   ____________________________________________  RADIOLOGY   No results found.  ____________________________________________    PROCEDURES  Procedure(s) performed:    Procedures    Medications  amoxicillin (AMOXIL) capsule 1,000 mg (has no administration in time range)     ____________________________________________   INITIAL IMPRESSION / ASSESSMENT AND PLAN / ED COURSE  Pertinent labs & imaging results that were available during my care of the patient were reviewed by me and considered in my medical decision making (see chart for details).  Review of the [place] CSRS was performed in accordance of the [facility] prior to dispensing any controlled drugs.      Patient's diagnosis is consistent with strep pharyngitis.  Patient presents emergency department with complaints of sore throat, subjective fever and headache starting today.  On exam, patient meets 4 out of 5 [place] criteria with only exception being a gender 68.  Based off exam I will treat the patient empirically for strep.  Patient will be prescribed amoxicillin and Magic mouthwash.  Follow-up with primary care as needed.  Tylenol Motrin at home for fever..  Patient is given ED precautions to return to the ED for any worsening or new symptoms.     ____________________________________________  FINAL CLINICAL IMPRESSION(S) / ED  DIAGNOSES  Final diagnoses:  Strep pharyngitis      NEW MEDICATIONS STARTED DURING THIS VISIT:  ED Discharge Orders         Ordered    amoxicillin (AMOXIL) 875 MG tablet  2 times daily     02/16/19 1558    magic mouthwash w/lidocaine SOLN  4 times daily    Note to Pharmacy:  Dispense in a 1/1/1 ratio. Use lidocaine, diphenhydramine, prednisolone   02/16/19 1558              This chart was dictated using voice recognition software/Dragon. Despite best efforts to proofread, errors can occur which can change the meaning. Any change was purely unintentional.    Racheal Patches, PA-C 02/16/19 1558    Rockne Menghini, MD 02/16/19 2312

## 2019-02-16 NOTE — ED Triage Notes (Signed)
Pt reports sore throat that started today. Denies fevers or cough.

## 2019-02-16 NOTE — ED Notes (Signed)
See triage note  Presents with sore throat which started today  Low grade fever noted on arrival

## 2021-01-10 ENCOUNTER — Other Ambulatory Visit: Payer: Self-pay

## 2021-01-10 DIAGNOSIS — Z20822 Contact with and (suspected) exposure to covid-19: Secondary | ICD-10-CM

## 2021-01-11 LAB — NOVEL CORONAVIRUS, NAA: SARS-CoV-2, NAA: NOT DETECTED

## 2021-01-11 LAB — SARS-COV-2, NAA 2 DAY TAT

## 2023-02-26 ENCOUNTER — Encounter: Payer: Self-pay | Admitting: Emergency Medicine

## 2023-02-26 ENCOUNTER — Emergency Department
Admission: EM | Admit: 2023-02-26 | Discharge: 2023-02-26 | Disposition: A | Discharge status: Home / Self Care | Payer: 59 | Attending: Emergency Medicine | Admitting: Emergency Medicine

## 2023-02-26 ENCOUNTER — Ambulatory Visit
Admission: EM | Admit: 2023-02-26 | Discharge: 2023-02-26 | Disposition: A | Discharge status: Home / Self Care | Payer: 59 | Attending: Emergency Medicine | Admitting: Emergency Medicine

## 2023-02-26 ENCOUNTER — Emergency Department: Payer: 59

## 2023-02-26 DIAGNOSIS — R0781 Pleurodynia: Secondary | ICD-10-CM | POA: Diagnosis not present

## 2023-02-26 DIAGNOSIS — M940 Chondrocostal junction syndrome [Tietze]: Secondary | ICD-10-CM | POA: Diagnosis not present

## 2023-02-26 DIAGNOSIS — R079 Chest pain, unspecified: Secondary | ICD-10-CM

## 2023-02-26 DIAGNOSIS — R000 Tachycardia, unspecified: Secondary | ICD-10-CM | POA: Diagnosis not present

## 2023-02-26 LAB — CBC
HCT: 46.5 % (ref 39.0–52.0)
Hemoglobin: 15.3 g/dL (ref 13.0–17.0)
MCH: 27.7 pg (ref 26.0–34.0)
MCHC: 32.9 g/dL (ref 30.0–36.0)
MCV: 84.2 fL (ref 80.0–100.0)
Platelets: 302 10*3/uL (ref 150–400)
RBC: 5.52 MIL/uL (ref 4.22–5.81)
RDW: 14.4 % (ref 11.5–15.5)
WBC: 10.8 10*3/uL — ABNORMAL HIGH (ref 4.0–10.5)
nRBC: 0 % (ref 0.0–0.2)

## 2023-02-26 LAB — BASIC METABOLIC PANEL
Anion gap: 11 (ref 5–15)
BUN: 11 mg/dL (ref 6–20)
CO2: 23 mmol/L (ref 22–32)
Calcium: 9.2 mg/dL (ref 8.9–10.3)
Chloride: 102 mmol/L (ref 98–111)
Creatinine, Ser: 0.93 mg/dL (ref 0.61–1.24)
GFR, Estimated: >60 mL/min (ref >60)
Glucose, Bld: 103 mg/dL — ABNORMAL HIGH (ref 70–99)
Potassium: 4 mmol/L (ref 3.5–5.1)
Sodium: 136 mmol/L (ref 135–145)

## 2023-02-26 LAB — TROPONIN I (HIGH SENSITIVITY)
Troponin I (High Sensitivity): 3 ng/L (ref <18)
Troponin I (High Sensitivity): 3 ng/L (ref <18)

## 2023-02-26 MED ORDER — MELOXICAM 15 MG PO TABS: 15.0000 mg | ORAL_TABLET | Freq: Every day | ORAL | 0 refills | Status: AC

## 2023-02-26 MED ORDER — IOHEXOL 350 MG/ML SOLN
100.0000 mL | Freq: Once | INTRAVENOUS | Status: AC | PRN
  Administered 2023-02-26: 100 mL via INTRAVENOUS

## 2023-02-26 MED ORDER — KETOROLAC TROMETHAMINE 30 MG/ML IJ SOLN
30.0000 mg | Freq: Once | INTRAMUSCULAR | Status: AC
  Administered 2023-02-26: 30 mg via INTRAVENOUS
  Filled 2023-02-26: qty 1

## 2023-02-26 NOTE — ED Provider Notes (Signed)
[facility] Provider Note  Patient Contact: 8:32 PM (approximate)   History   Chest Pain   HPI  Derek Ellis is a 37 y.o. male who presents the emergency department, aching of pleuritic chest pain.  Patient states that this happens along the left sternal border.  Been ongoing x 3 days.  Patient feels short of breath when this occurs.  It is somewhat intermittent in nature.  Describes it as a sharp sensation.  It is reproducible with palpation.  No GI complaints.  No cough.  No fevers or chills.  No cardiac history.     Physical Exam   Triage Vital Signs: ED Triage Vitals  Enc Vitals Group     BP 02/26/23 1729 135/88     Pulse Rate 02/26/23 1729 (!) 106     Resp 02/26/23 1729 18     Temp 02/26/23 1729 98.2 F (36.8 C)     Temp Source 02/26/23 1729 Oral     SpO2 02/26/23 1729 96 %     Weight 02/26/23 1739 266 lb 15.6 oz (121.1 kg)     Height 02/26/23 1739 6' (1.829 m)     Head Circumference --      Peak Flow --      Pain Score 02/26/23 1738 3     Pain Loc --      Pain Edu? --      Excl. in GC? --     Most recent vital signs: Vitals:   02/26/23 1729  BP: 135/88  Pulse: (!) 106  Resp: 18  Temp: 98.2 F (36.8 C)  SpO2: 96%     General: Alert and in no acute distress.   Cardiovascular:  Good peripheral perfusion.  Normal S1 and S2 with no appreciable murmurs, rubs, gallops Respiratory: Normal respiratory effort without tachypnea or retractions. Lungs CTAB. Good air entry to the bases with no decreased or absent breath sounds Gastrointestinal: Bowel sounds 4 quadrants. Soft and nontender to palpation. No guarding or rigidity. No palpable masses. No distention. No CVA tenderness. Musculoskeletal: Full range of motion to all extremities.  Palpation along the left sternal border reveals tenderness to palpation.  This reproduces patient's symptoms. Neurologic:  No gross focal neurologic deficits are appreciated.  Skin:   No rash  noted Other:   ED Results / Procedures / Treatments   Labs (all labs ordered are listed, but only abnormal results are displayed) Labs Reviewed  BASIC METABOLIC PANEL - Abnormal; Notable for the following components:      Result Value   Glucose, Bld 103 (*)    All other components within normal limits  CBC - Abnormal; Notable for the following components:   WBC 10.8 (*)    All other components within normal limits  TROPONIN I (HIGH SENSITIVITY)  TROPONIN I (HIGH SENSITIVITY)     EKG  ED ECG REPORT I, Delorise Royals Dayra Rapley,  personally viewed and interpreted this ECG.   Date: 02/26/2023  EKG Time: 1725 hrs.  Rate: 115 bpm  Rhythm: unchanged from previous tracings, sinus tachycardia  Axis: Normal axis  Intervals:none  ST&T Change: No gross ST elevation or depression noted.  Sinus cardia.  No STEMI.  RADIOLOGY  I personally viewed, evaluated, and interpreted these images as part of my medical decision making, as well as reviewing the written report by the radiologist.  ED Provider Interpretation: No cardiopulmonary findings on chest x-ray.  CT scan without evidence of PE or other acute findings  CT Angio Chest PE W and/or Wo Contrast  Result Date: 02/26/2023 CLINICAL DATA:  Chest pain EXAM: CT ANGIOGRAPHY CHEST WITH CONTRAST TECHNIQUE: Multidetector CT imaging of the chest was performed using the standard protocol during bolus administration of intravenous contrast. Multiplanar CT image reconstructions and MIPs were obtained to evaluate the vascular anatomy. RADIATION DOSE REDUCTION: This exam was performed according to the departmental dose-optimization program which includes automated exposure control, adjustment of the mA and/or kV according to patient size and/or use of iterative reconstruction technique. CONTRAST:  OMNIPAQUE IOHEXOL 350 MG/ML SOLN COMPARISON:  None Available. FINDINGS: Cardiovascular: Contrast injection is sufficient to demonstrate satisfactory  opacification of the pulmonary arteries to the segmental level. There is no pulmonary embolus or evidence of right heart strain. The size of the main pulmonary artery is normal. Heart size is normal, with no pericardial effusion. The course and caliber of the aorta are normal. There is no atherosclerotic calcification. Opacification decreased due to pulmonary arterial phase contrast bolus timing. Mediastinum/Nodes: No mediastinal, hilar or axillary lymphadenopathy. Normal visualized thyroid. Thoracic esophageal course is normal. Lungs/Pleura: Airways are patent. No pleural effusion, lobar consolidation, pneumothorax or pulmonary infarction. Upper Abdomen: Contrast bolus timing is not optimized for evaluation of the abdominal organs. The visualized portions of the organs of the upper abdomen are normal. Musculoskeletal: No chest wall abnormality. No bony spinal canal stenosis. Review of the MIP images confirms the above findings. IMPRESSION: No pulmonary embolus or acute aortic syndrome. Electronically Signed   By: Deatra Robinson M.D.   On: 02/26/2023 20:53   DG Chest 2 View  Result Date: 02/26/2023 CLINICAL DATA:  Chest pain EXAM: CHEST - 2 VIEW COMPARISON:  06/02/2015 FINDINGS: Diffuse bronchitic changes. No acute airspace disease, pleural effusion or pneumothorax. Normal cardiac size. IMPRESSION: Bronchitic changes without focal airspace disease. Electronically Signed   By: Jasmine Pang M.D.   On: 02/26/2023 18:52    PROCEDURES:  Critical Care performed: No  Procedures   MEDICATIONS ORDERED IN ED: Medications  ketorolac (TORADOL) 30 MG/ML injection 30 mg (has no administration in time range)  iohexol (OMNIPAQUE) 350 MG/ML injection 100 mL (100 mLs Intravenous Contrast Given 02/26/23 2042)     IMPRESSION / MDM / ASSESSMENT AND PLAN / ED COURSE  I reviewed the triage vital signs and the nursing notes.                                 Differential diagnosis includes, but is not limited to,  STEMI, ACS, costochondritis, rib fracture, PE, pneumonia, bronchitis.  The patient is on the cardiac monitor to evaluate for evidence of arrhythmia and/or significant heart rate changes.  Patient's presentation is most consistent with acute presentation with potential threat to life or bodily function.   Patient's diagnosis is consistent with costochondritis.  Patient presents emergency department with chest pain.  On discussion it appears that this is likely chest wall pain.  Patient has no palpitations, shortness of breath.  Patient was tender to palpation along the chest wall which reproduces patient's symptoms.  Given his tachycardia, reports of painful inspiration patient did have labs, troponin, EKG and CT scan for PE.  EKG, troponins, x-ray and CT scan are reassuring at this time.  Given the reproducible nature of the intercostal margin I suspect that patient has costochondritis.  Toradol given here in the ED.  Meloxicam at home.  Follow-up primary care as needed.  Return precautions discussed  with patient..  Patient is given ED precautions to return to the ED for any worsening or new symptoms.     FINAL CLINICAL IMPRESSION(S) / ED DIAGNOSES   Final diagnoses:  Costochondritis     Rx / DC Orders   ED Discharge Orders          Ordered    meloxicam (MOBIC) 15 MG tablet  Daily        02/26/23 2137             Note:  This document was prepared using Dragon voice recognition software and [place] include unintentional dictation errors.   Lanette Hampshire 02/26/23 2140    Shaune Pollack, MD 02/28/23 [phone]

## 2023-02-26 NOTE — ED Provider Notes (Signed)
Patient presents for evaluation of intermittent chest pain, shortness of breath, nausea without vomiting, dizziness beginning 3 days ago.  Symptoms worsening in severity and felt more prevalent today.  Lasting a few hours chest pain begins on the left, described as sharp pains initially then becoming pressure.  Has not attempted treatment.  Family history of" heart conditions".  Daily tobacco use.   Vital signs are stable, heart rate 114 in triage, EKG showing sinus tachycardia 100, discussed with patient however due to risk factors of family history, obesity and tobacco use he is being sent to the nearest emergency department for immediate evaluation and management,  to escort self   Hans Eden, NP 02/26/23 1641

## 2023-02-26 NOTE — ED Notes (Signed)
Patient is being discharged from the Urgent Care and sent to the Emergency Department via POV . Per Lowella Petties, NP, patient is in need of higher level of care due to Chest pain and shortness of breath. Patient is aware and verbalizes understanding of plan of care.  Vitals:   02/26/23 1618  BP: 130/88  Pulse: (!) 114  Resp: 18  Temp: 98.1 F (36.7 C)  SpO2: 96%

## 2023-02-26 NOTE — ED Triage Notes (Signed)
Pt c/o chest pain, shortness of breath, some nausea. Started about 3 days ago. He has family history of heart disease.

## 2023-02-26 NOTE — ED Triage Notes (Signed)
Pt presents to the ED due to CP that started on Sunday. Pt states the pain was worse his morning and got better throughout the day. Pt denies VD. Pt states his SOB is increasing. Pt NAD. Pt A&Ox4

## 2023-02-26 NOTE — Discharge Instructions (Signed)
Based on your symptoms, your family history and your tobacco use you have several risk factors which could indicate more serious heart conditions therefore you are being sent to the nearest emergency department for a full cardiac workup  Your EKG here in the office shows that while beating in a normal rhythm your heart rate is borderline elevated at 100, in our triage on the pulse oximeter it showed that your heart was beating fast at 114 which is likely is related to your symptoms

## 2024-08-23 DIAGNOSIS — K0889 Other specified disorders of teeth and supporting structures: Secondary | ICD-10-CM | POA: Diagnosis not present

## 2024-08-23 DIAGNOSIS — K047 Periapical abscess without sinus: Secondary | ICD-10-CM | POA: Insufficient documentation

## 2024-08-23 DIAGNOSIS — L0201 Cutaneous abscess of face: Secondary | ICD-10-CM | POA: Insufficient documentation

## 2024-08-23 DIAGNOSIS — K029 Dental caries, unspecified: Secondary | ICD-10-CM | POA: Insufficient documentation

## 2024-08-24 ENCOUNTER — Encounter: Payer: Self-pay | Admitting: Emergency Medicine

## 2024-08-24 ENCOUNTER — Emergency Department
Admission: EM | Admit: 2024-08-24 | Discharge: 2024-08-24 | Disposition: A | Discharge status: Home / Self Care | Attending: Emergency Medicine | Admitting: Emergency Medicine

## 2024-08-24 ENCOUNTER — Emergency Department

## 2024-08-24 ENCOUNTER — Other Ambulatory Visit: Payer: Self-pay

## 2024-08-24 DIAGNOSIS — K029 Dental caries, unspecified: Secondary | ICD-10-CM

## 2024-08-24 DIAGNOSIS — M868X8 Other osteomyelitis, other site: Secondary | ICD-10-CM

## 2024-08-24 DIAGNOSIS — K047 Periapical abscess without sinus: Secondary | ICD-10-CM

## 2024-08-24 LAB — CBC WITH DIFFERENTIAL/PLATELET
Abs Immature Granulocytes: 0.05 K/uL (ref 0.00–0.07)
Basophils Absolute: 0.1 K/uL (ref 0.0–0.1)
Basophils Relative: 1 %
Eosinophils Absolute: 0.3 K/uL (ref 0.0–0.5)
Eosinophils Relative: 3 %
HCT: 42.7 % (ref 39.0–52.0)
Hemoglobin: 14.4 g/dL (ref 13.0–17.0)
Immature Granulocytes: 1 %
Lymphocytes Relative: 29 %
Lymphs Abs: 3.2 K/uL (ref 0.7–4.0)
MCH: 29 pg (ref 26.0–34.0)
MCHC: 33.7 g/dL (ref 30.0–36.0)
MCV: 86.1 fL (ref 80.0–100.0)
Monocytes Absolute: 0.6 K/uL (ref 0.1–1.0)
Monocytes Relative: 6 %
Neutro Abs: 6.8 K/uL (ref 1.7–7.7)
Neutrophils Relative %: 60 %
Platelets: 268 K/uL (ref 150–400)
RBC: 4.96 MIL/uL (ref 4.22–5.81)
RDW: 13.6 % (ref 11.5–15.5)
WBC: 11.1 K/uL — ABNORMAL HIGH (ref 4.0–10.5)
nRBC: 0 % (ref 0.0–0.2)

## 2024-08-24 LAB — BASIC METABOLIC PANEL WITH GFR
Anion gap: 8 (ref 5–15)
BUN: 13 mg/dL (ref 6–20)
CO2: 27 mmol/L (ref 22–32)
Calcium: 8.6 mg/dL — ABNORMAL LOW (ref 8.9–10.3)
Chloride: 103 mmol/L (ref 98–111)
Creatinine, Ser: 0.86 mg/dL (ref 0.61–1.24)
GFR, Estimated: >60 mL/min (ref >60)
Glucose, Bld: 126 mg/dL — ABNORMAL HIGH (ref 70–99)
Potassium: 4.2 mmol/L (ref 3.5–5.1)
Sodium: 138 mmol/L (ref 135–145)

## 2024-08-24 MED ORDER — PREDNISONE 20 MG PO TABS
60.0000 mg | ORAL_TABLET | Freq: Every day | ORAL | 0 refills | Status: AC
Start: 2024-08-24 — End: 2024-08-29

## 2024-08-24 MED ORDER — ONDANSETRON HCL 4 MG/2ML IJ SOLN
4.0000 mg | INTRAMUSCULAR | Status: AC
  Administered 2024-08-24: 4 mg via INTRAVENOUS
  Filled 2024-08-24: qty 2

## 2024-08-24 MED ORDER — IOHEXOL 300 MG/ML  SOLN
100.0000 mL | Freq: Once | INTRAMUSCULAR | Status: AC | PRN
  Administered 2024-08-24: 100 mL via INTRAVENOUS

## 2024-08-24 MED ORDER — SODIUM CHLORIDE 0.9 % IV SOLN
3.0000 g | INTRAVENOUS | Status: AC
  Administered 2024-08-24: 3 g via INTRAVENOUS
  Filled 2024-08-24: qty 8

## 2024-08-24 MED ORDER — AMOXICILLIN-POT CLAVULANATE 875-125 MG PO TABS: 1.0000 | ORAL_TABLET | Freq: Two times a day (BID) | ORAL | 0 refills | Status: AC

## 2024-08-24 MED ORDER — FENTANYL CITRATE PF 50 MCG/ML IJ SOSY
50.0000 ug | PREFILLED_SYRINGE | Freq: Once | INTRAMUSCULAR | Status: AC
  Administered 2024-08-24: 50 ug via INTRAVENOUS
  Filled 2024-08-24: qty 1

## 2024-08-24 MED ORDER — DEXAMETHASONE SODIUM PHOSPHATE 10 MG/ML IJ SOLN
10.0000 mg | Freq: Once | INTRAMUSCULAR | Status: AC
  Administered 2024-08-24: 10 mg via INTRAVENOUS
  Filled 2024-08-24: qty 1

## 2024-08-24 MED ORDER — OXYCODONE-ACETAMINOPHEN 5-325 MG PO TABS: 2.0000 | ORAL_TABLET | Freq: Four times a day (QID) | ORAL | 0 refills | Status: AC | PRN

## 2024-08-24 MED ORDER — KETOROLAC TROMETHAMINE 30 MG/ML IJ SOLN
15.0000 mg | Freq: Once | INTRAMUSCULAR | Status: AC
  Administered 2024-08-24: 15 mg via INTRAVENOUS
  Filled 2024-08-24: qty 1

## 2024-08-24 NOTE — Discharge Instructions (Addendum)
 As we discussed, you have multiple cavities that has led to dental infection and the development of an abscess (collection of infection) in your left cheek.  We recommended admission to the hospital for 24 to 48 hours of IV antibiotics and steroids and ENT consultation, but we understand that you are not in a position that you can stay in the hospital at this time.  Please take the full course of antibiotics and steroids as prescribed.  It is important that you follow-up with a dentist or with Dr. Dannial at the contact information listed; he has both a dentist and an oral maxillofacial surgeon and should be able to help you further with your ongoing issues.  Please return immediately to the emergency department if you develop new or worsening symptoms that concern you.  Please do not take over-the-counter ibuprofen  until you are done taking the prescribed prednisone .  You can, however, take over-the-counter Tylenol  (acetaminophen ), up to 1000 mg every 6 hours.  Take Percocet as prescribed for severe pain. Do not drink alcohol, drive or participate in any other potentially dangerous activities while taking this medication as it Vandervoort make you sleepy. Do not take this medication with any other sedating medications, either prescription or over-the-counter. If you were prescribed Percocet or Vicodin, do not take these with acetaminophen  (Tylenol ) as it is already contained within these medications.   This medication is an opiate (or narcotic) pain medication and can be habit forming.  Use it as little as possible to achieve adequate pain control.  Do not use or use it with extreme caution if you have a history of opiate abuse or dependence.  If you are on a pain contract with your primary care doctor or a pain specialist, be sure to let them know you were prescribed this medication today from the [facility] Emergency Department.  This medication is intended for your use only - do not give any to anyone else and  keep it in a secure place where nobody else, especially children, have access to it.  It will also cause or worsen constipation, so you Scheirer want to consider taking an over-the-counter stool softener while you are taking this medication.

## 2024-08-24 NOTE — ED Provider Notes (Signed)
 [facility] Provider Note    Event Date/Time   First MD Initiated Contact with Patient 08/24/24 0012     (approximate)   History   Dental Pain   HPI Derek Ellis is a 38 y.o. male who presents for evaluation of about a week of left-sided facial pain.  He has a history of impacted wisdom teeth that he has not had enough money to have removed.  He has developed some swelling in the left upper part of his mouth and face which over the last few hours has gotten significantly worse.  Now the pain and swelling in his face goes all the way up to his left eye and it hurts to push on his face.  His teeth is started to ache and throb as well.  He is able to open his mouth, although it hurts to open the it extremely wide.  No trouble swallowing.  The pain does not seem to radiate down to his neck.  No neck pain or stiffness.  No recent fever, nausea, or vomiting.     Physical Exam   Triage Vital Signs: ED Triage Vitals  Encounter Vitals Group     BP 08/24/24 0008 (!) 166/104     Girls Systolic BP Percentile --      Girls Diastolic BP Percentile --      Boys Systolic BP Percentile --      Boys Diastolic BP Percentile --      Pulse Rate 08/24/24 0008 96     Resp 08/24/24 0008 20     Temp 08/24/24 0008 98.3 F (36.8 C)     Temp Source 08/24/24 0008 Oral     SpO2 08/24/24 0008 98 %     Weight 08/24/24 0009 (!) 163.3 kg (360 lb)     Height 08/24/24 0009 1.829 m (6')     Head Circumference --      Peak Flow --      Pain Score 08/24/24 0008 9     Pain Loc --      Pain Education --      Exclude from Growth Chart --     Most recent vital signs: Vitals:   08/24/24 0008 08/24/24 0038  BP: (!) 166/104   Pulse: 96   Resp: 20   Temp: 98.3 F (36.8 C)   SpO2: 98% 99%    General: Awake, appears uncomfortable from pain. CV:  Good peripheral perfusion.  Regular rate and rhythm. Resp:  Normal effort. Speaking easily and comfortably, no accessory muscle usage nor  intercostal retractions.   Abd:  Obese. Other:  Patient has obvious left-sided facial swelling all throughout the maxilla and extending up to and including the infraorbital area.  He has tenderness to palpation all throughout this area as well including right below the left orbit and even the left eyelid.  His pupils are equal and reactive and his extraocular movement is intact, normal, and equal bilaterally.  He has what appears to be impacted wisdom teeth on the left upper part of his mouth with at least 1 dental caries/fracture that appears chronic.  The entire area is very tender to palpation but there is no obvious periapical dental abscess.   ED Results / Procedures / Treatments   Labs (all labs ordered are listed, but only abnormal results are displayed) Labs Reviewed  CBC WITH DIFFERENTIAL/PLATELET - Abnormal; Notable for the following components:      Result Value   WBC 11.1 (*)  All other components within normal limits  BASIC METABOLIC PANEL WITH GFR - Abnormal; Notable for the following components:   Glucose, Bld 126 (*)    Calcium 8.6 (*)    All other components within normal limits      RADIOLOGY See ED course for details   PROCEDURES:  Critical Care performed: No  Procedures    IMPRESSION / MDM / ASSESSMENT AND PLAN / ED COURSE  I reviewed the triage vital signs and the nursing notes.                              Differential diagnosis includes, but is not limited to, facial cellulitis, odontogenic abscess, orbital cellulitis.  Patient's presentation is most consistent with acute presentation with potential threat to life or bodily function.  Labs/studies ordered: BMP, CBC, CT maxillofacial with IV contrast  Interventions/Medications given:  Medications  ketorolac  (TORADOL ) 30 MG/ML injection 15 mg (15 mg Intravenous Given 08/24/24 0135)  ondansetron  (ZOFRAN ) injection 4 mg (4 mg Intravenous Given 08/24/24 0133)  fentaNYL  (SUBLIMAZE ) injection 50 mcg (50  mcg Intravenous Given 08/24/24 0137)  Ampicillin -Sulbactam (UNASYN ) 3 g in sodium chloride  0.9 % 100 mL IVPB (3 g Intravenous New Bag/Given 08/24/24 0202)  dexamethasone  (DECADRON ) injection 10 mg (10 mg Intravenous Given 08/24/24 0134)  iohexol  (OMNIPAQUE ) 300 MG/ML solution 100 mL (100 mLs Intravenous Contrast Given 08/24/24 0213)    (Note:  hospital course my include additional interventions and/or labs/studies not listed above.)   I had a discussion with the patient regarding empiric treatment with oral antibiotics and steroids versus further assessment by CT scan to look for evidence of facial abscess or orbital cellulitis.  After we discussed the pros and cons, he would like to proceed with a full workup which I think is reasonable given the infraorbital involvement.  I am treating empirically with Unasyn  3 g IV and Decadron  10 mg IV to help with the swelling.  He has reported allergy to morphine so I am ordering fentanyl  50 mcg IV as well as Zofran  for milligrams IV and Toradol  15 mg IV.  I will reassess after the imaging and lab work.     Clinical Course as of 08/24/24 0300  Mon Aug 24, 2024  0253 CT Maxillofacial W Contrast I independently viewed and interpret the patient's maxillofacial CT and I can appreciate the inflammation and cellulitis in the left side of the patient's face.  There appears to be an abscess which the radiologist confirmed as a 1.2 cm subperiosteal abscess of the left maxilla. [CF]  0254 I reassessed the patient.  He is feeling better but still has the swelling.  I explained to him the diagnosis and I recommended admission for at least 24 hours of IV antibiotics and Decadron .  However, he said that he cannot stay in the hospital because he has to work.  As an alternative, I will prescribe him Augmentin  and prednisone  and he promises to return to the ED if he develops new or worsening symptoms.  His mother is with him and agrees with the plan. [CF]    Clinical Course User  Index [CF] Gordan Huxley, MD     FINAL CLINICAL IMPRESSION(S) / ED DIAGNOSES   Final diagnoses:  Abscess of facial bone (HCC)  Dental caries  Dental infection     Rx / DC Orders   ED Discharge Orders          Ordered  amoxicillin -clavulanate (AUGMENTIN ) 875-125 MG tablet  2 times daily        08/24/24 0300    predniSONE  (DELTASONE ) 20 MG tablet  Daily with breakfast        08/24/24 0300    oxyCODONE -acetaminophen  (PERCOCET) 5-325 MG tablet  Every 6 hours PRN        08/24/24 0300             Note:  This document was prepared using Dragon voice recognition software and Brosh include unintentional dictation errors.   Gordan Huxley, MD 08/24/24 0300

## 2024-08-24 NOTE — ED Triage Notes (Signed)
 Patient reports left side dental pain that started today. Reports some swelling to left side of face as well. Denies fevers at home.
